# Patient Record
Sex: Male | Born: 1945
Health system: Southern US, Community
[De-identification: ages and names within clinical notes are randomized; demographics above are authoritative.]

## PROBLEM LIST (undated history)

## (undated) DIAGNOSIS — F17200 Nicotine dependence, unspecified, uncomplicated: Secondary | ICD-10-CM

## (undated) HISTORY — DX: Nicotine dependence, unspecified, uncomplicated: F17.200

---

## 2008-10-30 ENCOUNTER — Ambulatory Visit (HOSPITAL_COMMUNITY): Admission: RE | Admit: 2008-10-30 | Discharge: 2008-10-30 | Payer: Self-pay | Admitting: Internal Medicine

## 2010-09-12 ENCOUNTER — Other Ambulatory Visit (HOSPITAL_COMMUNITY): Payer: Self-pay | Admitting: Gastroenterology

## 2010-09-12 ENCOUNTER — Other Ambulatory Visit: Payer: Self-pay | Admitting: Gastroenterology

## 2010-09-12 DIAGNOSIS — K2970 Gastritis, unspecified, without bleeding: Secondary | ICD-10-CM

## 2010-09-16 ENCOUNTER — Other Ambulatory Visit: Payer: Self-pay

## 2010-09-17 ENCOUNTER — Ambulatory Visit
Admission: RE | Admit: 2010-09-17 | Discharge: 2010-09-17 | Disposition: A | Discharge status: Home / Self Care | Payer: BC Managed Care – PPO | Source: Ambulatory Visit | Attending: Gastroenterology | Admitting: Gastroenterology

## 2010-09-17 DIAGNOSIS — K2970 Gastritis, unspecified, without bleeding: Secondary | ICD-10-CM

## 2010-09-25 ENCOUNTER — Ambulatory Visit (HOSPITAL_COMMUNITY)
Admission: RE | Admit: 2010-09-25 | Discharge: 2010-09-25 | Disposition: A | Discharge status: Home / Self Care | Payer: BC Managed Care – PPO | Source: Ambulatory Visit | Attending: Gastroenterology | Admitting: Gastroenterology

## 2010-09-25 DIAGNOSIS — R141 Gas pain: Secondary | ICD-10-CM | POA: Insufficient documentation

## 2010-09-25 DIAGNOSIS — R143 Flatulence: Secondary | ICD-10-CM | POA: Insufficient documentation

## 2010-09-25 DIAGNOSIS — K2970 Gastritis, unspecified, without bleeding: Secondary | ICD-10-CM

## 2010-09-25 DIAGNOSIS — R1110 Vomiting, unspecified: Secondary | ICD-10-CM | POA: Insufficient documentation

## 2010-09-25 DIAGNOSIS — R142 Eructation: Secondary | ICD-10-CM | POA: Insufficient documentation

## 2010-09-25 MED ORDER — TECHNETIUM TC 99M SULFUR COLLOID
2.0000 | Freq: Once | INTRAVENOUS | Status: AC | PRN
  Administered 2010-09-25: 2 via INTRAVENOUS

## 2011-02-26 ENCOUNTER — Emergency Department (HOSPITAL_COMMUNITY): Payer: No Typology Code available for payment source

## 2011-02-26 ENCOUNTER — Encounter: Payer: Self-pay | Admitting: Emergency Medicine

## 2011-02-26 ENCOUNTER — Emergency Department (HOSPITAL_COMMUNITY)
Admission: EM | Admit: 2011-02-26 | Discharge: 2011-02-26 | Disposition: A | Discharge status: Home / Self Care | Payer: No Typology Code available for payment source | Attending: Emergency Medicine | Admitting: Emergency Medicine

## 2011-02-26 DIAGNOSIS — M542 Cervicalgia: Secondary | ICD-10-CM | POA: Insufficient documentation

## 2011-02-26 DIAGNOSIS — R51 Headache: Secondary | ICD-10-CM | POA: Insufficient documentation

## 2011-02-26 DIAGNOSIS — M546 Pain in thoracic spine: Secondary | ICD-10-CM | POA: Insufficient documentation

## 2011-02-26 DIAGNOSIS — R109 Unspecified abdominal pain: Secondary | ICD-10-CM | POA: Insufficient documentation

## 2011-02-26 DIAGNOSIS — R0602 Shortness of breath: Secondary | ICD-10-CM | POA: Insufficient documentation

## 2011-02-26 DIAGNOSIS — R079 Chest pain, unspecified: Secondary | ICD-10-CM | POA: Insufficient documentation

## 2011-02-26 LAB — POCT I-STAT, CHEM 8
HCT: 50 % (ref 39.0–52.0)
Hemoglobin: 17 g/dL (ref 13.0–17.0)
Sodium: 142 mEq/L (ref 135–145)
TCO2: 23 mmol/L (ref 0–100)

## 2011-02-26 LAB — CBC
HCT: 46.4 % (ref 39.0–52.0)
MCHC: 36.4 g/dL — ABNORMAL HIGH (ref 30.0–36.0)
Platelets: 233 10*3/uL (ref 150–400)
RDW: 12.4 % (ref 11.5–15.5)

## 2011-02-26 LAB — COMPREHENSIVE METABOLIC PANEL
ALT: 29 U/L (ref 0–53)
Alkaline Phosphatase: 87 U/L (ref 39–117)
CO2: 25 mEq/L (ref 19–32)
Chloride: 104 mEq/L (ref 96–112)
GFR calc Af Amer: >90 mL/min (ref >90)
Glucose, Bld: 109 mg/dL — ABNORMAL HIGH (ref 70–99)
Potassium: 4.2 mEq/L (ref 3.5–5.1)
Sodium: 139 mEq/L (ref 135–145)
Total Bilirubin: 0.9 mg/dL (ref 0.3–1.2)
Total Protein: 7.5 g/dL (ref 6.0–8.3)

## 2011-02-26 LAB — URINALYSIS, MICROSCOPIC ONLY
Bilirubin Urine: NEGATIVE
Hgb urine dipstick: NEGATIVE
Protein, ur: NEGATIVE mg/dL
Urobilinogen, UA: 0.2 mg/dL (ref 0.0–1.0)

## 2011-02-26 LAB — PROTIME-INR
INR: 0.95 (ref 0.00–1.49)
Prothrombin Time: 12.9 seconds (ref 11.6–15.2)

## 2011-02-26 LAB — SAMPLE TO BLOOD BANK

## 2011-02-26 MED ORDER — ONDANSETRON HCL 4 MG/2ML IJ SOLN
INTRAMUSCULAR | Status: AC
  Filled 2011-02-26: qty 2

## 2011-02-26 MED ORDER — IOHEXOL 300 MG/ML  SOLN
100.0000 mL | Freq: Once | INTRAMUSCULAR | Status: AC | PRN
  Administered 2011-02-26: 100 mL via INTRAVENOUS

## 2011-02-26 MED ORDER — IBUPROFEN 800 MG PO TABS
800.0000 mg | ORAL_TABLET | Freq: Once | ORAL | Status: AC
  Administered 2011-02-26: 800 mg via ORAL
  Filled 2011-02-26: qty 1

## 2011-02-26 MED ORDER — SODIUM CHLORIDE 0.9 % IV BOLUS (SEPSIS)
1000.0000 mL | Freq: Once | INTRAVENOUS | Status: AC
  Administered 2011-02-26: 1000 mL via INTRAVENOUS

## 2011-02-26 MED ORDER — HYDROCODONE-ACETAMINOPHEN 5-500 MG PO TABS: 1.0000 | ORAL_TABLET | Freq: Four times a day (QID) | ORAL | Status: AC | PRN

## 2011-02-26 MED ORDER — ONDANSETRON HCL 8 MG PO TABS
4.0000 mg | ORAL_TABLET | Freq: Once | ORAL | Status: AC
  Administered 2011-02-26: 4 mg via ORAL

## 2011-02-26 MED ORDER — ONDANSETRON 4 MG PO TBDP
ORAL_TABLET | ORAL | Status: AC
  Administered 2011-02-26: 4 mg
  Filled 2011-02-26: qty 1

## 2011-02-26 MED ORDER — IBUPROFEN 800 MG PO TABS: 800.0000 mg | ORAL_TABLET | Freq: Three times a day (TID) | ORAL | Status: DC
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
## 2011-02-26 NOTE — ED Notes (Signed)
Pt c/o of discomfort from c-collar. Awaiting radiology results to clear c-collar. Pt told via family intpretering.

## 2011-02-27 ENCOUNTER — Encounter (HOSPITAL_COMMUNITY): Payer: Self-pay | Admitting: Emergency Medicine

## 2011-03-01 NOTE — ED Provider Notes (Signed)
I saw and evaluated the patient, reviewed the resident's note and I agree with the findings and plan.  Klohe Lovering A. Patrica Duel, MD 03/01/11 [phone]

## 2011-06-03 DIAGNOSIS — Z86010 Personal history of colonic polyps: Secondary | ICD-10-CM | POA: Diagnosis not present

## 2011-06-03 DIAGNOSIS — D126 Benign neoplasm of colon, unspecified: Secondary | ICD-10-CM | POA: Diagnosis not present

## 2011-06-03 DIAGNOSIS — Z09 Encounter for follow-up examination after completed treatment for conditions other than malignant neoplasm: Secondary | ICD-10-CM | POA: Diagnosis not present

## 2011-06-03 DIAGNOSIS — K5730 Diverticulosis of large intestine without perforation or abscess without bleeding: Secondary | ICD-10-CM | POA: Diagnosis not present

## 2011-12-15 DIAGNOSIS — H2510 Age-related nuclear cataract, unspecified eye: Secondary | ICD-10-CM | POA: Diagnosis not present

## 2011-12-28 DIAGNOSIS — H1045 Other chronic allergic conjunctivitis: Secondary | ICD-10-CM | POA: Diagnosis not present

## 2012-01-05 DIAGNOSIS — H1045 Other chronic allergic conjunctivitis: Secondary | ICD-10-CM | POA: Diagnosis not present

## 2012-01-09 ENCOUNTER — Encounter (HOSPITAL_COMMUNITY): Payer: Self-pay | Admitting: *Deleted

## 2012-01-09 DIAGNOSIS — R110 Nausea: Secondary | ICD-10-CM | POA: Insufficient documentation

## 2012-01-09 DIAGNOSIS — F17200 Nicotine dependence, unspecified, uncomplicated: Secondary | ICD-10-CM | POA: Diagnosis not present

## 2012-01-09 DIAGNOSIS — R42 Dizziness and giddiness: Secondary | ICD-10-CM | POA: Diagnosis not present

## 2012-01-09 DIAGNOSIS — R51 Headache: Secondary | ICD-10-CM | POA: Diagnosis not present

## 2012-01-09 DIAGNOSIS — R109 Unspecified abdominal pain: Secondary | ICD-10-CM | POA: Diagnosis not present

## 2012-01-09 LAB — COMPREHENSIVE METABOLIC PANEL
AST: 25 U/L (ref 0–37)
Albumin: 3.8 g/dL (ref 3.5–5.2)
Alkaline Phosphatase: 68 U/L (ref 39–117)
Chloride: 105 mEq/L (ref 96–112)
Creatinine, Ser: 0.9 mg/dL (ref 0.50–1.35)
Potassium: 4 mEq/L (ref 3.5–5.1)
Total Bilirubin: 0.8 mg/dL (ref 0.3–1.2)
Total Protein: 7.4 g/dL (ref 6.0–8.3)

## 2012-01-09 LAB — URINALYSIS, ROUTINE W REFLEX MICROSCOPIC
Bilirubin Urine: NEGATIVE
Nitrite: NEGATIVE
Specific Gravity, Urine: 1.025 (ref 1.005–1.030)
pH: 6.5 (ref 5.0–8.0)

## 2012-01-09 LAB — CBC WITH DIFFERENTIAL/PLATELET
Basophils Absolute: 0 10*3/uL (ref 0.0–0.1)
Basophils Relative: 0 % (ref 0–1)
MCHC: 36.4 g/dL — ABNORMAL HIGH (ref 30.0–36.0)
Neutro Abs: 3.7 10*3/uL (ref 1.7–7.7)
Neutrophils Relative %: 54 % (ref 43–77)
RDW: 12.3 % (ref 11.5–15.5)

## 2012-01-09 LAB — LIPASE, BLOOD: Lipase: 36 U/L (ref 11–59)

## 2012-01-09 NOTE — ED Notes (Signed)
The pt has a headache and abd pain with vomiting.  langauge barrier

## 2012-01-10 ENCOUNTER — Encounter (HOSPITAL_COMMUNITY): Payer: Self-pay | Admitting: Emergency Medicine

## 2012-01-10 ENCOUNTER — Emergency Department (HOSPITAL_COMMUNITY)
Admission: EM | Admit: 2012-01-10 | Discharge: 2012-01-10 | Disposition: A | Discharge status: Home / Self Care | Payer: Medicare Other | Attending: Emergency Medicine | Admitting: Emergency Medicine

## 2012-01-10 DIAGNOSIS — R42 Dizziness and giddiness: Secondary | ICD-10-CM

## 2012-01-10 MED ORDER — MECLIZINE HCL 25 MG PO TABS: 25.0000 mg | ORAL_TABLET | Freq: Three times a day (TID) | ORAL | Status: DC | PRN

## 2012-01-10 NOTE — ED Notes (Signed)
Patient said he was bloated this morning and was nauseated. Patient says he went to work and at work he started to have a headache and dizzy and so he responded to the ED.

## 2012-01-10 NOTE — ED Provider Notes (Signed)
History     CSN: [id number]  Arrival date & time 01/09/12  2122   First MD Initiated Contact with Patient 01/10/12 0214      Chief Complaint  Patient presents with  . Headache    (Consider location/radiation/quality/duration/timing/severity/associated sxs/prior treatment) Patient is a 66 y.o. male presenting with headaches. The history is provided by the patient. A language interpreter was used.  Headache   He had onset this morning of symptom complex which included a headache, dizzy feeling, and his abdomen feeling hard or is too much air in it. There was some associated nausea but without vomiting. He noticed that he was off balance. Dizziness feeling was like the room was spinning around. Symptoms have now resolved. All symptoms tended to come on in concert and debate in concert. Dizziness was worsened by head movement. Symptoms were severe but have now resolved. He did not treat himself with any medications.  History reviewed. No pertinent past medical history.  History reviewed. No pertinent past surgical history.  History reviewed. No pertinent family history.  History  Substance Use Topics  . Smoking status: Current Every Day Smoker -- 0.5 packs/day  . Smokeless tobacco: Not on file  . Alcohol Use: 1.8 oz/week    3 Cans of beer per week      Review of Systems  Neurological: Positive for headaches.  All other systems reviewed and are negative.    Allergies  Penicillins  Home Medications   Current Outpatient Rx  Name Route Sig Dispense Refill  . IBUPROFEN 800 MG PO TABS Oral Take 1 tablet (800 mg total) by mouth 3 (three) times daily. 21 tablet 0  . OMEGA-3-ACID ETHYL ESTERS 1 G PO CAPS Oral Take 1 g by mouth daily.        BP 126/92  Pulse 57  Temp 97.9 F (36.6 C) (Oral)  Resp 18  SpO2 98%  Physical Exam  Nursing note and vitals reviewed. 66 year old male, resting comfortably and in no acute distress. Vital signs are significant for hypertension  with blood pressure 180/99. Oxygen saturation is 98%, which is normal. Head is normocephalic and atraumatic. PERRLA, EOMI. Oropharynx is clear. Neck is nontender and supple without adenopathy or JVD. There no carotid bruits. Back is nontender and there is no CVA tenderness. Lungs are clear without rales, wheezes, or rhonchi. Chest is nontender. Heart has regular rate and rhythm without murmur. Abdomen is soft, flat, nontender without masses or hepatosplenomegaly and peristalsis is normoactive. Extremities have no cyanosis or edema, full range of motion is present. Skin is warm and dry without rash. Neurologic: Mental status is normal, cranial nerves are intact, there are no motor or sensory deficits. Dizziness is not reproduced by head movement.   ED Course  Procedures (including critical care time)  Results for orders placed during the hospital encounter of 01/10/12  CBC WITH DIFFERENTIAL      Component Value Range   WBC 6.9  4.0 - 10.5 K/uL   RBC 4.91  4.22 - 5.81 MIL/uL   Hemoglobin 16.8  13.0 - 17.0 g/dL   HCT 40.9  81.1 - 91.4 %   MCV 93.9  78.0 - 100.0 fL   MCH 34.2 (*) 26.0 - 34.0 pg   MCHC 36.4 (*) 30.0 - 36.0 g/dL   RDW 78.2  95.6 - 21.3 %   Platelets 251  150 - 400 K/uL   Neutrophils Relative 54  43 - 77 %   Neutro Abs 3.7  1.7 -  7.7 K/uL   Lymphocytes Relative 37  12 - 46 %   Lymphs Abs 2.6  0.7 - 4.0 K/uL   Monocytes Relative 8  3 - 12 %   Monocytes Absolute 0.6  0.1 - 1.0 K/uL   Eosinophils Relative 1  0 - 5 %   Eosinophils Absolute 0.0  0.0 - 0.7 K/uL   Basophils Relative 0  0 - 1 %   Basophils Absolute 0.0  0.0 - 0.1 K/uL  COMPREHENSIVE METABOLIC PANEL      Component Value Range   Sodium 140  135 - 145 mEq/L   Potassium 4.0  3.5 - 5.1 mEq/L   Chloride 105  96 - 112 mEq/L   CO2 22  19 - 32 mEq/L   Glucose, Bld 108 (*) 70 - 99 mg/dL   BUN 17  6 - 23 mg/dL   Creatinine, Ser 6.21  0.50 - 1.35 mg/dL   Calcium 9.4  8.4 - 30.8 mg/dL   Total Protein 7.4  6.0 -  8.3 g/dL   Albumin 3.8  3.5 - 5.2 g/dL   AST 25  0 - 37 U/L   ALT 16  0 - 53 U/L   Alkaline Phosphatase 68  39 - 117 U/L   Total Bilirubin 0.8  0.3 - 1.2 mg/dL   GFR calc non Af Amer 87 (*) >90 mL/min   GFR calc Af Amer >90  >90 mL/min  TROPONIN I      Component Value Range   Troponin I <0.30  <0.30 ng/mL  LIPASE, BLOOD      Component Value Range   Lipase 36  11 - 59 U/L  URINALYSIS, ROUTINE W REFLEX MICROSCOPIC      Component Value Range   Color, Urine YELLOW  YELLOW   APPearance CLEAR  CLEAR   Specific Gravity, Urine 1.025  1.005 - 1.030   pH 6.5  5.0 - 8.0   Glucose, UA NEGATIVE  NEGATIVE mg/dL   Hgb urine dipstick NEGATIVE  NEGATIVE   Bilirubin Urine NEGATIVE  NEGATIVE   Ketones, ur NEGATIVE  NEGATIVE mg/dL   Protein, ur NEGATIVE  NEGATIVE mg/dL   Urobilinogen, UA 1.0  0.0 - 1.0 mg/dL   Nitrite NEGATIVE  NEGATIVE   Leukocytes, UA NEGATIVE  NEGATIVE    1. Vertigo       MDM  Symptom complex which seems most compatible with peripheral vertigo. His complaint of abdominal pain is actually his abdomen feeling tight like there was a lot of gas in it and this was only occurring during times that he was experiencing vertigo. Abdominal exam is completely benign and laboratory workup is completely normal. At this point, I will treat him symptomatically with meclizine. He does have a history of having had a colonoscopy and if he has further GI complaints, he is to followup with his gastroenterologist.        Dione Booze, MD 01/10/12 [phone]

## 2012-01-10 NOTE — ED Notes (Signed)
Patient is alert and orientedx4.  Patient's wife was able to read the discharge instructions in her language and had no questions.  Patient's wife is transporting husband home.

## 2012-01-19 DIAGNOSIS — H1045 Other chronic allergic conjunctivitis: Secondary | ICD-10-CM | POA: Diagnosis not present

## 2012-01-25 ENCOUNTER — Ambulatory Visit (INDEPENDENT_AMBULATORY_CARE_PROVIDER_SITE_OTHER): Payer: Medicare Other | Admitting: Internal Medicine

## 2012-01-25 ENCOUNTER — Encounter: Payer: Self-pay | Admitting: Internal Medicine

## 2012-01-25 VITALS — BP 130/62 | HR 53 | Temp 97.8°F | Resp 16 | Ht 64.0 in | Wt 147.8 lb

## 2012-01-25 DIAGNOSIS — Z23 Encounter for immunization: Secondary | ICD-10-CM | POA: Diagnosis not present

## 2012-01-25 DIAGNOSIS — K219 Gastro-esophageal reflux disease without esophagitis: Secondary | ICD-10-CM

## 2012-01-25 DIAGNOSIS — K2960 Other gastritis without bleeding: Secondary | ICD-10-CM | POA: Diagnosis not present

## 2012-01-25 DIAGNOSIS — T39395A Adverse effect of other nonsteroidal anti-inflammatory drugs [NSAID], initial encounter: Secondary | ICD-10-CM

## 2012-01-25 DIAGNOSIS — F17200 Nicotine dependence, unspecified, uncomplicated: Secondary | ICD-10-CM | POA: Insufficient documentation

## 2012-01-25 DIAGNOSIS — K319 Disease of stomach and duodenum, unspecified: Secondary | ICD-10-CM

## 2012-01-25 DIAGNOSIS — F809 Developmental disorder of speech and language, unspecified: Secondary | ICD-10-CM

## 2012-01-25 DIAGNOSIS — R42 Dizziness and giddiness: Secondary | ICD-10-CM | POA: Diagnosis not present

## 2012-01-25 MED ORDER — PNEUMOCOCCAL VAC POLYVALENT 25 MCG/0.5ML IJ INJ: 0.5000 mL | INJECTION | INTRAMUSCULAR | Status: DC

## 2012-01-25 MED ORDER — OMEPRAZOLE 40 MG PO CPDR: 40.0000 mg | DELAYED_RELEASE_CAPSULE | Freq: Every day | ORAL | Status: DC

## 2012-01-25 NOTE — Patient Instructions (Addendum)
Gastroesophageal Reflux Disease, Adult Gastroesophageal reflux disease (GERD) happens when acid from your stomach flows up into the esophagus. When acid comes in contact with the esophagus, the acid causes soreness (inflammation) in the esophagus. Over time, GERD may create small holes (ulcers) in the lining of the esophagus. CAUSES   Increased body weight. This puts pressure on the stomach, making acid rise from the stomach into the esophagus.  Smoking. This increases acid production in the stomach.  Drinking alcohol. This causes decreased pressure in the lower esophageal sphincter (valve or ring of muscle between the esophagus and stomach), allowing acid from the stomach into the esophagus.  Late evening meals and a full stomach. This increases pressure and acid production in the stomach.  A malformed lower esophageal sphincter. Sometimes, no cause is found. SYMPTOMS   Burning pain in the lower part of the mid-chest behind the breastbone and in the mid-stomach area. This may occur twice a week or more often.  Trouble swallowing.  Sore throat.  Dry cough.  Asthma-like symptoms including chest tightness, shortness of breath, or wheezing. DIAGNOSIS  Your caregiver may be able to diagnose GERD based on your symptoms. In some cases, X-rays and other tests may be done to check for complications or to check the condition of your stomach and esophagus. TREATMENT  Your caregiver may recommend over-the-counter or prescription medicines to help decrease acid production. Ask your caregiver before starting or adding any new medicines.  HOME CARE INSTRUCTIONS   Change the factors that you can control. Ask your caregiver for guidance concerning weight loss, quitting smoking, and alcohol consumption.  Avoid foods and drinks that make your symptoms worse, such as:  Caffeine or alcoholic drinks.  Chocolate.  Peppermint or mint flavorings.  Garlic and onions.  Spicy foods.  Citrus fruits,  such as oranges, lemons, or limes.  Tomato-based foods such as sauce, chili, salsa, and pizza.  Fried and fatty foods.  Avoid lying down for the 3 hours prior to your bedtime or prior to taking a nap.  Eat small, frequent meals instead of large meals.  Wear loose-fitting clothing. Do not wear anything tight around your waist that causes pressure on your stomach.  Raise the head of your bed 6 to 8 inches with wood blocks to help you sleep. Extra pillows will not help.  Only take over-the-counter or prescription medicines for pain, discomfort, or fever as directed by your caregiver.  Do not take aspirin, ibuprofen, or other nonsteroidal anti-inflammatory drugs (NSAIDs). SEEK IMMEDIATE MEDICAL CARE IF:   You have pain in your arms, neck, jaw, teeth, or back.  Your pain increases or changes in intensity or duration.  You develop nausea, vomiting, or sweating (diaphoresis).  You develop shortness of breath, or you faint.  Your vomit is green, yellow, black, or looks like coffee grounds or blood.  Your stool is red, bloody, or black. These symptoms could be signs of other problems, such as heart disease, gastric bleeding, or esophageal bleeding. MAKE SURE YOU:   Understand these instructions.  Will watch your condition.  Will get help right away if you are not doing well or get worse. Document Released: 01/14/2005 Document Revised: 06/29/2011 Document Reviewed: 10/24/2010 [employer] Patient Information 2013 [employer], [place]. NSAIDs and Peptic Ulcers A peptic ulcer is a defect that forms in the lining of the stomach or first part of the small intestine.  CAUSES  Most peptic ulcers are caused by infection with the bacterium H. pylori (Helicobacter pylori). Some peptic ulcers  are caused by prolonged use of NSAIDs (nonsteroidal anti-inflammatory drugs). NSAIDs include aspirin, ibuprofen, and naproxen sodium. SYMPTOMS  An ulcer can cause:  A gnawing, burning pain in the upper  abdomen.  Nausea.  Vomiting.  Loss of appetite.  Weight loss.  Fatigue. Normally the stomach has three defenses against digestive juices:   Mucus that coats the stomach lining and shields it from stomach acid.  The chemical bicarbonate that neutralizes stomach acid.  Blood circulation to the stomach lining that aids in cell renewal and repair. NSAIDs hinder all of these protective mechanisms. With the stomach's defenses down, digestive juices can damage the sensitive stomach lining and cause ulcers. TREATMENT   NSAID-induced ulcers usually heal once the person stops taking the medication. Your caregiver may recommend taking antacids to neutralize the acid. Antacids help the healing process and relieve symptoms. Drugs called H2-blockers or proton-pump inhibitors decrease the amount of acid the stomach produces. Medicines that protect the stomach lining also help with healing.  If a person with an NSAID ulcer also tests positive for H. pylori, he or she will be treated with antibiotics.  Surgery may be necessary if an ulcer recurs or fails to heal.  Surgery may also be necessary if complications like:  Severe bleeding.  Perforation.  Obstruction develop. Anyone taking NSAIDs who experiences symptoms of peptic ulcer should see their caregiver for prompt treatment. Delaying diagnosis and treatment can lead to complications and the need for surgery. FOR MORE INFORMATION National Digestive Diseases Information Clearinghouse: http://digestive.EntertainmentBlogging.co.nz.htm Document Released: 03/12/2004 Document Revised: 06/29/2011 Document Reviewed: 03/22/2007 [facility] Patient Information 2013 [place], [place].

## 2012-01-25 NOTE — Progress Notes (Signed)
  Subjective:    Patient ID: Harry Arellano, male    DOB: [birth year], 66 y.o.   MRN: [id number]  HPI Dizzyness resolved, ER visit and all tests reviewed, wife interpretor. Has nsaid gastopathy and is using ibuprofen! Continues to smoke,has nausea with eating. Genella Rife also No weight loss. LFTs and lipase nl in er. Old ct showed cholelithiasis.   Review of Systems Needs cpe    Objective:   Physical Exam  Constitutional: He appears well-developed and well-nourished. No distress.  HENT:  Right Ear: External ear normal.  Left Ear: External ear normal.  Nose: Nose normal.  Eyes: EOM are normal. No scleral icterus.  Neck: Normal range of motion. Neck supple. No thyromegaly present.  Cardiovascular: Normal rate.   Pulmonary/Chest: Effort normal and breath sounds normal.  Abdominal: Soft. Bowel sounds are normal. He exhibits no mass. There is tenderness. There is no rebound and no guarding.  Neurological: He is alert. No cranial nerve deficit. He exhibits normal muscle tone. Coordination normal.  Skin: Skin is warm and dry.  Psychiatric: He has a normal mood and affect. His behavior is normal.          Assessment & Plan:  Flu vac/pneumovax Prilosec restart CPE 1-2 mo

## 2012-04-18 ENCOUNTER — Encounter: Payer: Medicare Other | Admitting: Internal Medicine

## 2012-09-15 ENCOUNTER — Ambulatory Visit: Payer: Medicare Other

## 2012-09-15 ENCOUNTER — Ambulatory Visit (INDEPENDENT_AMBULATORY_CARE_PROVIDER_SITE_OTHER): Payer: Medicare Other | Admitting: Family Medicine

## 2012-09-15 VITALS — BP 104/80 | HR 65 | Temp 97.9°F | Resp 18 | Ht 65.0 in | Wt 148.0 lb

## 2012-09-15 DIAGNOSIS — K219 Gastro-esophageal reflux disease without esophagitis: Secondary | ICD-10-CM | POA: Diagnosis not present

## 2012-09-15 DIAGNOSIS — R5381 Other malaise: Secondary | ICD-10-CM | POA: Diagnosis not present

## 2012-09-15 DIAGNOSIS — Z789 Other specified health status: Secondary | ICD-10-CM | POA: Insufficient documentation

## 2012-09-15 DIAGNOSIS — Z758 Other problems related to medical facilities and other health care: Secondary | ICD-10-CM | POA: Insufficient documentation

## 2012-09-15 DIAGNOSIS — M549 Dorsalgia, unspecified: Secondary | ICD-10-CM

## 2012-09-15 DIAGNOSIS — R21 Rash and other nonspecific skin eruption: Secondary | ICD-10-CM

## 2012-09-15 DIAGNOSIS — R5383 Other fatigue: Secondary | ICD-10-CM | POA: Diagnosis not present

## 2012-09-15 DIAGNOSIS — R110 Nausea: Secondary | ICD-10-CM

## 2012-09-15 LAB — POCT CBC
HCT, POC: 52.4 % (ref 43.5–53.7)
Lymph, poc: 1.8 (ref 0.6–3.4)
MCH, POC: 33.7 pg — AB (ref 27–31.2)
MCHC: 33 g/dL (ref 31.8–35.4)
MCV: 102 fL — AB (ref 80–97)
POC Granulocyte: 4.4 (ref 2–6.9)
POC LYMPH PERCENT: 27 %L (ref 10–50)
RDW, POC: 14.1 %
WBC: 6.6 10*3/uL (ref 4.6–10.2)

## 2012-09-15 LAB — COMPREHENSIVE METABOLIC PANEL
BUN: 12 mg/dL (ref 6–23)
CO2: 26 mEq/L (ref 19–32)
Calcium: 9.6 mg/dL (ref 8.4–10.5)
Chloride: 104 mEq/L (ref 96–112)
Creat: 0.86 mg/dL (ref 0.50–1.35)
Glucose, Bld: 123 mg/dL — ABNORMAL HIGH (ref 70–99)
Total Bilirubin: 1 mg/dL (ref 0.3–1.2)

## 2012-09-15 LAB — GLUCOSE, POCT (MANUAL RESULT ENTRY): POC Glucose: 116 mg/dl — AB (ref 70–99)

## 2012-09-15 MED ORDER — ONDANSETRON HCL 8 MG PO TABS: 8.0000 mg | ORAL_TABLET | Freq: Three times a day (TID) | ORAL | Status: DC | PRN

## 2012-09-15 MED ORDER — OMEPRAZOLE 40 MG PO CPDR: 40.0000 mg | DELAYED_RELEASE_CAPSULE | Freq: Every day | ORAL | Status: DC

## 2012-09-15 MED ORDER — ONDANSETRON 4 MG PO TBDP
4.0000 mg | ORAL_TABLET | Freq: Once | ORAL | Status: AC
  Administered 2012-09-15: 4 mg via ORAL

## 2012-09-15 MED ORDER — TRIAMCINOLONE ACETONIDE 0.1 % EX CREA: TOPICAL_CREAM | Freq: Two times a day (BID) | CUTANEOUS | Status: DC

## 2012-09-15 NOTE — Patient Instructions (Addendum)
Please take the prilosec (omeprazole) once a day.  You can also use the carafate as needed for nausea.    If you do not continue to feel better please let me know.    Your blood sugar is not too high, but it is borderline.  Also, your red blood cell size is up.  Please come and see [place] for a complete physical in the next few months.

## 2012-09-15 NOTE — Progress Notes (Addendum)
[facility] [address] [place] [address] [phone]  Date:  09/15/2012   Name:  Harry Arellano   DOB:  10-26-1945   MRN:  [id number]  PCP:  No primary provider on file.    Chief Complaint: Dizziness   History of Present Illness:  Harry Arellano is a 67 y.o. very pleasant male patient who presents with the following:  He was seen at the ED last fall with vertigo and was treated with meclinzine.   Here today with a friend who helped to interpret for him.  This morning he noted a HA, but this is now resolved.   He also was dizzy this morning, had nausea but not vomiting.   His also notes a pain in his upper back today- noted this morning.  It is now "80%" better  No longer feels dizzy.    He is no longer taking prilosec.  He notes that he is having burping again. He feels bloated and "filled with air."  He does not have any CP, no SOB He does also note irritated skin on both arm for the last year or so.  He has not used anything for this so far.    Patient Active Problem List   Diagnosis Date Noted  . NSAID-associated gastropathy 01/25/2012  . Nicotine addiction 01/25/2012  . GERD (gastroesophageal reflux disease) 01/25/2012    Past Medical History  Diagnosis Date  . Smoker     History reviewed. No pertinent past surgical history.  History  Substance Use Topics  . Smoking status: Former Smoker -- 0.50 packs/day  . Smokeless tobacco: Not on file  . Alcohol Use: 1.8 oz/week    3 Cans of beer per week    History reviewed. No pertinent family history.  Allergies  Allergen Reactions  . Penicillins Other (See Comments)    Pass out     Medication list has been reviewed and updated.  Current Outpatient Prescriptions on File Prior to Visit  Medication Sig Dispense Refill  . ibuprofen (ADVIL,MOTRIN) 200 MG tablet Take 200-800 mg by mouth every 6 (six) hours as needed. Headache or pain      . meclizine (ANTIVERT) 25 MG tablet Take 1 tablet (25 mg  total) by mouth 3 (three) times daily as needed.  30 tablet  0  . omeprazole (PRILOSEC) 40 MG capsule Take 1 capsule (40 mg total) by mouth daily.  90 capsule  3   No current facility-administered medications on file prior to visit.    Review of Systems:  As per HPI- otherwise negative.   Physical Examination: Filed Vitals:   09/15/12 1316  BP: 104/80  Pulse: 65  Temp: 97.9 F (36.6 C)  Resp: 18   Filed Vitals:   09/15/12 1316  Height: 5\' 5"  (1.651 m)  Weight: 148 lb (67.132 kg)   Body mass index is 24.63 kg/(m^2). Ideal Body Weight: Weight in (lb) to have BMI = 25: 149.9  GEN: WDWN, NAD, Non-toxic, A & O x 3, looks well HEENT: Atraumatic, Normocephalic. Neck supple. No masses, No LAD.  Bilateral TM wnl, oropharynx normal.  PEERL,EOMI.   Ears and Nose: No external deformity.   CV: RRR, No M/G/R. No JVD. No thrill. No extra heart sounds. PULM: CTA B, no wheezes, crackles, rhonchi. No retractions. No resp. distress. No accessory muscle use. ABD: S, NT, ND, +BS. No rebound. No HSM.  Upon lying down he had belching and discomfort He has mild, reproducible tenderness in  the muscles around his right shoulder blade.  Tender to pressure, but not with movement of his right arm.  EXTR: No c/c/e NEURO Normal gait. Normal strength, sensation and DTR all extremities.  Normal balance.  No meningismus.   PSYCH: Normally interactive. Conversant. Not depressed or anxious appearing.  Calm demeanor.  The skin of both arms is slightly red and irritated.    [facility] reading (PRIMARY) by  Dr. Patsy Lager. VHQ:IONGEX *RADIOLOGY REPORT*  Clinical Data: Pain in left upper back.  CHEST - 2 VIEW  Comparison: None.  Findings: Lateral view is motion degraded. Midline trachea. Borderline cardiomegaly. Mediastinal contours otherwise within normal limits. Mild right hemidiaphragm elevation. No pleural effusion or pneumothorax. Clear lungs. Degenerative disc disease in the mid lumbar  spine.  IMPRESSION: No acute cardiopulmonary disease.  EKG: NSR, no change from previous EKG  Results for orders placed in visit on 09/15/12  POCT CBC      Result Value Range   WBC 6.6  4.6 - 10.2 K/uL   Lymph, poc 1.8  0.6 - 3.4   POC LYMPH PERCENT 27.0  10 - 50 %L   MID (cbc) 0.4  0 - 0.9   POC MID % 6.1  0 - 12 %M   POC Granulocyte 4.4  2 - 6.9   Granulocyte percent 66.9  37 - 80 %G   RBC 5.14  4.69 - 6.13 M/uL   Hemoglobin 17.3  14.1 - 18.1 g/dL   HCT, POC 52.8  41.3 - 53.7 %   MCV 102.0 (*) 80 - 97 fL   MCH, POC 33.7 (*) 27 - 31.2 pg   MCHC 33.0  31.8 - 35.4 g/dL   RDW, POC 24.4     Platelet Count, POC 263  142 - 424 K/uL   MPV 8.4  0 - 99.8 fL  GLUCOSE, POCT (MANUAL RESULT ENTRY)      Result Value Range   POC Glucose 116 (*) 70 - 99 mg/dl   Given zofran 4mg , and omaprazole 20 mg Assessment and Plan: Nausea alone - Plan: ondansetron (ZOFRAN-ODT) disintegrating tablet 4 mg, ondansetron (ZOFRAN) 8 MG tablet  GERD (gastroesophageal reflux disease) - Plan: omeprazole (PRILOSEC) 40 MG capsule  Other malaise and fatigue - Plan: POCT CBC, POCT glucose (manual entry), Comprehensive metabolic panel  Back pain - Plan: DG Chest 2 View, EKG 12-Lead  Rash, skin - Plan: triamcinolone cream (KENALOG) 0.1 %  Language barrier  [name] is here today with somewhat vague symptoms of dizziness, headache, and what sounds like GERD today.  At this point he states that his symptoms are nearly 100% resolved.  He does not have any weakness or numbness.  No CP or SOB.  He felt better after zofran and omeprazole here in clinic.   Will continue to use a PPI, and zofran as needed for nausea He may use triamcinolone cream as needed on his arms.    Signed Abbe Amsterdam, MD  09/19/12- addnd.  Called again- someone picked up at pt's number but hung up because we do not speak the same language.  LMOM with his son's cell phone. Letter mailed today as well

## 2012-09-19 ENCOUNTER — Encounter: Payer: Self-pay | Admitting: Family Medicine

## 2012-11-01 DIAGNOSIS — H02059 Trichiasis without entropian unspecified eye, unspecified eyelid: Secondary | ICD-10-CM | POA: Diagnosis not present

## 2012-12-13 DIAGNOSIS — H11009 Unspecified pterygium of unspecified eye: Secondary | ICD-10-CM | POA: Diagnosis not present

## 2013-01-04 DIAGNOSIS — H269 Unspecified cataract: Secondary | ICD-10-CM | POA: Diagnosis not present

## 2013-01-04 DIAGNOSIS — H11009 Unspecified pterygium of unspecified eye: Secondary | ICD-10-CM | POA: Diagnosis not present

## 2013-05-11 DIAGNOSIS — H11009 Unspecified pterygium of unspecified eye: Secondary | ICD-10-CM | POA: Diagnosis not present

## 2013-05-11 DIAGNOSIS — H11069 Recurrent pterygium of unspecified eye: Secondary | ICD-10-CM | POA: Diagnosis not present

## 2013-05-17 DIAGNOSIS — Z4889 Encounter for other specified surgical aftercare: Secondary | ICD-10-CM | POA: Diagnosis not present

## 2013-05-17 DIAGNOSIS — H11009 Unspecified pterygium of unspecified eye: Secondary | ICD-10-CM | POA: Diagnosis not present

## 2013-06-08 ENCOUNTER — Ambulatory Visit (INDEPENDENT_AMBULATORY_CARE_PROVIDER_SITE_OTHER): Payer: Medicare Other | Admitting: Emergency Medicine

## 2013-06-08 ENCOUNTER — Ambulatory Visit: Payer: Medicare Other

## 2013-06-08 VITALS — BP 124/80 | HR 60 | Temp 98.2°F | Resp 16 | Ht 64.25 in | Wt 144.0 lb

## 2013-06-08 DIAGNOSIS — K8020 Calculus of gallbladder without cholecystitis without obstruction: Secondary | ICD-10-CM | POA: Diagnosis not present

## 2013-06-08 DIAGNOSIS — K219 Gastro-esophageal reflux disease without esophagitis: Secondary | ICD-10-CM | POA: Diagnosis not present

## 2013-06-08 DIAGNOSIS — Z125 Encounter for screening for malignant neoplasm of prostate: Secondary | ICD-10-CM | POA: Diagnosis not present

## 2013-06-08 DIAGNOSIS — K8000 Calculus of gallbladder with acute cholecystitis without obstruction: Secondary | ICD-10-CM

## 2013-06-08 DIAGNOSIS — F17200 Nicotine dependence, unspecified, uncomplicated: Secondary | ICD-10-CM | POA: Diagnosis not present

## 2013-06-08 DIAGNOSIS — R109 Unspecified abdominal pain: Secondary | ICD-10-CM

## 2013-06-08 DIAGNOSIS — F1920 Other psychoactive substance dependence, uncomplicated: Secondary | ICD-10-CM | POA: Insufficient documentation

## 2013-06-08 DIAGNOSIS — E785 Hyperlipidemia, unspecified: Secondary | ICD-10-CM | POA: Diagnosis not present

## 2013-06-08 LAB — POCT CBC
GRANULOCYTE PERCENT: 41.7 % (ref 37–80)
HEMATOCRIT: 48.1 % (ref 43.5–53.7)
HEMOGLOBIN: 15.6 g/dL (ref 14.1–18.1)
Lymph, poc: 2.4 (ref 0.6–3.4)
MCH, POC: 33.3 pg — AB (ref 27–31.2)
MCHC: 32.4 g/dL (ref 31.8–35.4)
MCV: 102.8 fL — AB (ref 80–97)
MID (cbc): 0.4 (ref 0–0.9)
MPV: 9.1 fL (ref 0–99.8)
POC GRANULOCYTE: 2 (ref 2–6.9)
POC LYMPH PERCENT: 49.7 %L (ref 10–50)
POC MID %: 8.6 %M (ref 0–12)
Platelet Count, POC: 225 10*3/uL (ref 142–424)
RBC: 4.68 M/uL — AB (ref 4.69–6.13)
RDW, POC: 13.4 %
WBC: 4.8 10*3/uL (ref 4.6–10.2)

## 2013-06-08 LAB — LIPID PANEL
CHOL/HDL RATIO: 3.9 ratio
Cholesterol: 201 mg/dL — ABNORMAL HIGH (ref 0–200)
HDL: 52 mg/dL (ref >39)
LDL CALC: 126 mg/dL — AB (ref 0–99)
Triglycerides: 115 mg/dL (ref <150)
VLDL: 23 mg/dL (ref 0–40)

## 2013-06-08 LAB — POCT URINALYSIS DIPSTICK
BILIRUBIN UA: NEGATIVE
Blood, UA: NEGATIVE
Glucose, UA: NEGATIVE
Ketones, UA: NEGATIVE
LEUKOCYTES UA: NEGATIVE
NITRITE UA: NEGATIVE
PH UA: 6
Spec Grav, UA: >=1.03
Urobilinogen, UA: 0.2

## 2013-06-08 LAB — COMPREHENSIVE METABOLIC PANEL
ALBUMIN: 4.3 g/dL (ref 3.5–5.2)
ALK PHOS: 57 U/L (ref 39–117)
ALT: 17 U/L (ref 0–53)
AST: 20 U/L (ref 0–37)
BUN: 11 mg/dL (ref 6–23)
CO2: 27 mEq/L (ref 19–32)
CREATININE: 0.85 mg/dL (ref 0.50–1.35)
Calcium: 8.9 mg/dL (ref 8.4–10.5)
Chloride: 104 mEq/L (ref 96–112)
Glucose, Bld: 116 mg/dL — ABNORMAL HIGH (ref 70–99)
POTASSIUM: 4.3 meq/L (ref 3.5–5.3)
Sodium: 141 mEq/L (ref 135–145)
Total Bilirubin: 0.6 mg/dL (ref 0.2–1.2)
Total Protein: 7.1 g/dL (ref 6.0–8.3)

## 2013-06-08 LAB — PSA, MEDICARE: PSA: 0.94 ng/mL (ref <=4.00)

## 2013-06-08 LAB — IFOBT (OCCULT BLOOD): IMMUNOLOGICAL FECAL OCCULT BLOOD TEST: NEGATIVE

## 2013-06-08 LAB — LIPASE: Lipase: 19 U/L (ref 0–75)

## 2013-06-08 MED ORDER — OMEPRAZOLE 40 MG PO CPDR: 40.0000 mg | DELAYED_RELEASE_CAPSULE | Freq: Every day | ORAL | Status: DC

## 2013-06-08 NOTE — Progress Notes (Signed)
Subjective:    Patient ID: Harry Arellano, male    DOB: 03-16-1946, 68 y.o.   MRN: [id number] This chart was scribed for Arlyss Queen, MD by Rolanda Lundborg, ED Scribe. This patient was seen in room 5 and the patient's care was started at 10:36 AM.  Chief Complaint  Patient presents with  . Follow-up    wants cholesterol checked and wants to talk about digestion burps alot and has burny stuff come up    HPI HPI Comments: Harry Arellano is a 68 y.o. male with a h/o hyperglycemia and gall stones who presents to the [facility] for a cholesterol check. He had it checked last year and it was normal but states he wants it rechecked because he has "not been feeling right". He reports unchanged indigestion after meals with nausea but no vomiting. He reports intermittent dizziness. He denies CP, abdominal pain, dysuria. He has had 2 endoscopies. He quit smoking 2 years ago. He has 4-5 bottles of beer per day.  PCP - No primary provider on file.   Past Medical History  Diagnosis Date  . Smoker    Current Outpatient Prescriptions on File Prior to Visit  Medication Sig Dispense Refill  . omeprazole (PRILOSEC) 40 MG capsule Take 1 capsule (40 mg total) by mouth daily.  30 capsule  3  . ibuprofen (ADVIL,MOTRIN) 200 MG tablet Take 200-800 mg by mouth every 6 (six) hours as needed. Headache or pain      . meclizine (ANTIVERT) 25 MG tablet Take 1 tablet (25 mg total) by mouth 3 (three) times daily as needed.  30 tablet  0  . ondansetron (ZOFRAN) 8 MG tablet Take 1 tablet (8 mg total) by mouth every 8 (eight) hours as needed for nausea.  15 tablet  0  . triamcinolone cream (KENALOG) 0.1 % Apply topically 2 (two) times daily.  30 g  0   No current facility-administered medications on file prior to visit.   Allergies  Allergen Reactions  . Penicillins Other (See Comments)    Pass out      Review of Systems  Cardiovascular: Negative for chest pain.  Gastrointestinal: Positive for nausea.  Negative for vomiting and abdominal pain.  Genitourinary: Negative for dysuria.  Neurological: Positive for dizziness.       Objective:   Physical Exam CONSTITUTIONAL: Well developed/well nourished HEAD: Normocephalic/atraumatic EYES: EOMI/PERRL ENMT: Mucous membranes moist NECK: supple no meningeal signs SPINE:entire spine nontender CV: S1/S2 noted, no murmurs/rubs/gallops noted LUNGS: Lungs are clear to auscultation bilaterally, no apparent distress ABDOMEN: soft, nontender, no rebound or guarding GU:no cva tenderness NEURO: Pt is awake/alert, moves all extremitiesx4 EXTREMITIES: pulses normal, full ROM SKIN: warm, color normal. Vitiligo on his hands PSYCH: no abnormalities of mood noted  Filed Vitals:   06/08/13 0947  BP: 124/80  Pulse: 60  Temp: 98.2 F (36.8 C)  TempSrc: Oral  Resp: 16  Height: 5' 4.25" (1.632 m)  Weight: 144 lb (65.318 kg)  SpO2: 97%   [facility] reading (PRIMARY) by  Dr. Everlene Farrier no acute disease Results for orders placed in visit on 06/08/13  POCT CBC      Result Value Ref Range   WBC 4.8  4.6 - 10.2 K/uL   Lymph, poc 2.4  0.6 - 3.4   POC LYMPH PERCENT 49.7  10 - 50 %L   MID (cbc) 0.4  0 - 0.9   POC MID % 8.6  0 - 12 %M   POC  Granulocyte 2.0  2 - 6.9   Granulocyte percent 41.7  37 - 80 %G   RBC 4.68 (*) 4.69 - 6.13 M/uL   Hemoglobin 15.6  14.1 - 18.1 g/dL   HCT, POC 48.1  43.5 - 53.7 %   MCV 102.8 (*) 80 - 97 fL   MCH, POC 33.3 (*) 27 - 31.2 pg   MCHC 32.4  31.8 - 35.4 g/dL   RDW, POC 13.4     Platelet Count, POC 225  142 - 424 K/uL   MPV 9.1  0 - 99.8 fL  IFOBT (OCCULT BLOOD)      Result Value Ref Range   IFOBT Negative    POCT URINALYSIS DIPSTICK      Result Value Ref Range   Color, UA yellow     Clarity, UA clear     Glucose, UA neg     Bilirubin, UA neg     Ketones, UA neg     Spec Grav, UA >=1.030     Blood, UA neg     pH, UA 6.0     Protein, UA trace     Urobilinogen, UA 0.2     Nitrite, UA neg     Leukocytes, UA Negative            Assessment & Plan:  Patient is to decrease his alcohol intake. I refilled his PPI. Referral made back to Dr. Penelope Coop. He is supposed to have a repeat colonoscopy in 3 years but it has been 5 years now. He may need a repeat endoscopy. I will also inform them he did have a gallstone on his last ultrasound  **Disclaimer: This note was dictated with voice recognition software. Similar sounding words can inadvertently be transcribed and this note may contain transcription errors which may not have been corrected upon publication of note.**  I personally performed the services described in this documentation, which was scribed in my presence. The recorded information has been reviewed and is accurate.

## 2013-06-22 DIAGNOSIS — R143 Flatulence: Secondary | ICD-10-CM | POA: Diagnosis not present

## 2013-06-22 DIAGNOSIS — K219 Gastro-esophageal reflux disease without esophagitis: Secondary | ICD-10-CM | POA: Diagnosis not present

## 2013-06-22 DIAGNOSIS — R141 Gas pain: Secondary | ICD-10-CM | POA: Diagnosis not present

## 2014-08-13 ENCOUNTER — Other Ambulatory Visit: Payer: Self-pay

## 2014-08-13 DIAGNOSIS — K219 Gastro-esophageal reflux disease without esophagitis: Secondary | ICD-10-CM

## 2014-08-13 MED ORDER — OMEPRAZOLE 40 MG PO CPDR: 40.0000 mg | DELAYED_RELEASE_CAPSULE | Freq: Every day | ORAL | Status: DC

## 2014-08-13 NOTE — Telephone Encounter (Signed)
[facility] called and asked for VO for RF of omeprazole. Gave 1 mos RF w/note "pt needs OV" for more.

## 2014-10-08 ENCOUNTER — Other Ambulatory Visit: Payer: Self-pay | Admitting: Emergency Medicine

## 2014-10-24 DIAGNOSIS — H2513 Age-related nuclear cataract, bilateral: Secondary | ICD-10-CM | POA: Diagnosis not present

## 2014-10-24 DIAGNOSIS — H11002 Unspecified pterygium of left eye: Secondary | ICD-10-CM | POA: Diagnosis not present

## 2014-10-24 DIAGNOSIS — H02003 Unspecified entropion of right eye, unspecified eyelid: Secondary | ICD-10-CM | POA: Diagnosis not present

## 2014-11-22 DIAGNOSIS — H02831 Dermatochalasis of right upper eyelid: Secondary | ICD-10-CM | POA: Diagnosis not present

## 2014-11-22 DIAGNOSIS — H02002 Unspecified entropion of right lower eyelid: Secondary | ICD-10-CM | POA: Diagnosis not present

## 2014-11-22 DIAGNOSIS — H02834 Dermatochalasis of left upper eyelid: Secondary | ICD-10-CM | POA: Diagnosis not present

## 2014-11-29 ENCOUNTER — Ambulatory Visit (INDEPENDENT_AMBULATORY_CARE_PROVIDER_SITE_OTHER): Payer: Medicare Other | Admitting: Physician Assistant

## 2014-11-29 VITALS — BP 132/78 | HR 58 | Temp 98.5°F | Resp 16 | Ht 65.0 in | Wt 144.0 lb

## 2014-11-29 DIAGNOSIS — Z23 Encounter for immunization: Secondary | ICD-10-CM | POA: Diagnosis not present

## 2014-11-29 DIAGNOSIS — Z1389 Encounter for screening for other disorder: Secondary | ICD-10-CM | POA: Diagnosis not present

## 2014-11-29 DIAGNOSIS — Z1211 Encounter for screening for malignant neoplasm of colon: Secondary | ICD-10-CM

## 2014-11-29 DIAGNOSIS — K219 Gastro-esophageal reflux disease without esophagitis: Secondary | ICD-10-CM | POA: Diagnosis not present

## 2014-11-29 DIAGNOSIS — Z0000 Encounter for general adult medical examination without abnormal findings: Secondary | ICD-10-CM

## 2014-11-29 LAB — POCT CBC
Granulocyte percent: 56.2 %G (ref 37–80)
HCT, POC: 48.7 % (ref 43.5–53.7)
HEMOGLOBIN: 14.9 g/dL (ref 14.1–18.1)
Lymph, poc: 2.7 (ref 0.6–3.4)
MCH, POC: 30 pg (ref 27–31.2)
MCHC: 30.7 g/dL — AB (ref 31.8–35.4)
MCV: 97.8 fL — AB (ref 80–97)
MID (cbc): 0.2 (ref 0–0.9)
MPV: 7.5 fL (ref 0–99.8)
POC GRANULOCYTE: 3.7 (ref 2–6.9)
POC LYMPH PERCENT: 40.9 %L (ref 10–50)
POC MID %: 2.9 % (ref 0–12)
Platelet Count, POC: 229 10*3/uL (ref 142–424)
RBC: 4.98 M/uL (ref 4.69–6.13)
RDW, POC: 13.5 %
WBC: 6.6 10*3/uL (ref 4.6–10.2)

## 2014-11-29 LAB — POCT URINALYSIS DIPSTICK
BILIRUBIN UA: NEGATIVE
GLUCOSE UA: NEGATIVE
KETONES UA: NEGATIVE
LEUKOCYTES UA: NEGATIVE
Nitrite, UA: NEGATIVE
PROTEIN UA: NEGATIVE
Spec Grav, UA: 1.025
Urobilinogen, UA: 0.2
pH, UA: 5.5

## 2014-11-29 LAB — POCT GLYCOSYLATED HEMOGLOBIN (HGB A1C): Hemoglobin A1C: 5.3

## 2014-11-29 MED ORDER — OMEPRAZOLE 40 MG PO CPDR: 40.0000 mg | DELAYED_RELEASE_CAPSULE | Freq: Every day | ORAL | Status: DC

## 2014-11-29 MED ORDER — ZOSTER VACCINE LIVE 19400 UNT/0.65ML ~~LOC~~ SOLR: 0.6500 mL | Freq: Once | SUBCUTANEOUS | Status: DC

## 2014-11-29 NOTE — Progress Notes (Signed)
11/29/2014 at 8:53 PM  Harry Arellano / DOB: 1945/05/05 / MRN: [id number]  The patient has NSAID-associated gastropathy; Nicotine addiction; GERD (gastroesophageal reflux disease); Language barrier; and Substance dependence on his problem list.  SUBJECTIVE  Harry Arellano is a 69 y.o. well appearing male presenting for the chief complaint of a headache that occurred once last Sunday and has subsided since. He has a Optometrist with him and he says he wants his blood to be checked for anything that could be wrong. He quit smoking 3 years ago.  He drinks 4 beers per day.  Denies ilicits. He has had two colonoscopies in the past, which are not in [facility].  Per Dr. Perfecto Kingdom last note he is in need of another colonoscopy. He has never had     Immunization History  Administered Date(s) Administered  . Influenza Split 01/25/2012  . Pneumococcal Conjugate-13 11/29/2014  . Td 11/29/2014    He  has a past medical history of Smoker.    Medications reviewed and updated by myself where necessary, and exist elsewhere in the encounter.   Harry Arellano is allergic to penicillins. He  reports that he has quit smoking. He does not have any smokeless tobacco history on file. He reports that he drinks about 1.8 oz of alcohol per week. He reports that he does not use illicit drugs. He  has no sexual activity history on file. The patient  has no past surgical history on file.  His family history is not on file.  Review of Systems  Constitutional: Negative for fever and chills.  Respiratory: Negative for cough.   Cardiovascular: Negative for chest pain.  Gastrointestinal: Negative for vomiting, abdominal pain and diarrhea.  Genitourinary: Negative for dysuria.  Musculoskeletal: Negative for myalgias.  Skin: Negative for itching and rash.  Neurological: Negative for dizziness and headaches.    OBJECTIVE  His  height is 5\' 5"  (1.651 m) and weight is 144 lb (65.318 kg). His oral temperature is 98.5 F (36.9 C). His blood  pressure is 132/78 and his pulse is 58. His respiration is 16 and oxygen saturation is 98%.  The patient's body mass index is 23.96 kg/(m^2).  Physical Exam  Vitals reviewed. Constitutional: He is oriented to person, place, and time. He appears well-developed. No distress.  Eyes: EOM are normal. Pupils are equal, round, and reactive to light. No scleral icterus.  Neck: Normal range of motion.  Cardiovascular: Normal rate and regular rhythm.   Respiratory: Effort normal and breath sounds normal.  GI: He exhibits no distension.  Musculoskeletal: Normal range of motion.  Neurological: He is alert and oriented to person, place, and time. No cranial nerve deficit.  Skin: Skin is warm and dry. No rash noted. He is not diaphoretic.  Psychiatric: He has a normal mood and affect.    Results for orders placed or performed in visit on 11/29/14 (from the past 24 hour(s))  POCT CBC     Status: Abnormal   Collection Time: 11/29/14  5:47 PM  Result Value Ref Range   WBC 6.6 4.6 - 10.2 K/uL   Lymph, poc 2.7 0.6 - 3.4   POC LYMPH PERCENT 40.9 10 - 50 %L   MID (cbc) 0.2 0 - 0.9   POC MID % 2.9 0 - 12 %M   POC Granulocyte 3.7 2 - 6.9   Granulocyte percent 56.2 37 - 80 %G   RBC 4.98 4.69 - 6.13 M/uL   Hemoglobin 14.9 14.1 - 18.1 g/dL  HCT, POC 48.7 43.5 - 53.7 %   MCV 97.8 (A) 80 - 97 fL   MCH, POC 30.0 27 - 31.2 pg   MCHC 30.7 (A) 31.8 - 35.4 g/dL   RDW, POC 13.5 %   Platelet Count, POC 229 142 - 424 K/uL   MPV 7.5 0 - 99.8 fL  POCT glycosylated hemoglobin (Hb A1C)     Status: None   Collection Time: 11/29/14  5:47 PM  Result Value Ref Range   Hemoglobin A1C 5.3   POCT urinalysis dipstick     Status: None   Collection Time: 11/29/14  5:47 PM  Result Value Ref Range   Color, UA yellow    Clarity, UA clear    Glucose, UA neg    Bilirubin, UA neg    Ketones, UA neg    Spec Grav, UA 1.025    Blood, UA trace    pH, UA 5.5    Protein, UA neg    Urobilinogen, UA 0.2    Nitrite, UA neg     Leukocytes, UA Negative Negative    ASSESSMENT & PLAN  Harry Arellano was seen today for headache, medication refill and pt requests to have blood and urine tested.  Diagnoses and all orders for this visit:  Annual physical exam  Need for prophylactic vaccination with combined diphtheria-tetanus-pertussis (DTP) vaccine -     Td vaccine greater than or equal to 7yo preservative free IM  Need for prophylactic vaccination against Streptococcus pneumoniae (pneumococcus) -     Pneumococcal conjugate vaccine 13-valent  Need for shingles vaccine  Special screening for malignant neoplasms, colon -     Ambulatory referral to Gastroenterology  Screening for nephropathy -     POCT CBC -     POCT glycosylated hemoglobin (Hb A1C) -     POCT urinalysis dipstick -     Comprehensive metabolic panel  Gastroesophageal reflux disease, esophagitis presence not specified -     omeprazole (PRILOSEC) 40 MG capsule; Take 1 capsule (40 mg total) by mouth daily.  Other orders -     zoster vaccine live, PF, (ZOSTAVAX) 55374 UNT/0.65ML injection; Inject 19,400 Units into the skin once.    The patient was advised to call or come back to clinic if he does not see an improvement in symptoms, or worsens with the above plan.   Philis Fendt, MHS, PA-C [facility] 11/29/2014 8:53 PM

## 2014-11-30 LAB — COMPREHENSIVE METABOLIC PANEL
ALBUMIN: 4.2 g/dL (ref 3.6–5.1)
ALK PHOS: 54 U/L (ref 40–115)
ALT: 16 U/L (ref 9–46)
AST: 21 U/L (ref 10–35)
BILIRUBIN TOTAL: 1.2 mg/dL (ref 0.2–1.2)
BUN: 17 mg/dL (ref 7–25)
CO2: 26 mmol/L (ref 20–31)
Calcium: 9 mg/dL (ref 8.6–10.3)
Chloride: 108 mmol/L (ref 98–110)
Creat: 0.87 mg/dL (ref 0.70–1.25)
GLUCOSE: 96 mg/dL (ref 65–99)
POTASSIUM: 4.9 mmol/L (ref 3.5–5.3)
Sodium: 142 mmol/L (ref 135–146)
Total Protein: 7.3 g/dL (ref 6.1–8.1)

## 2014-12-01 NOTE — Progress Notes (Signed)
  Medical screening examination/treatment/procedure(s) were performed by non-physician practitioner and as supervising physician I was immediately available for consultation/collaboration.     

## 2014-12-02 ENCOUNTER — Other Ambulatory Visit: Payer: Self-pay | Admitting: Physician Assistant

## 2014-12-02 DIAGNOSIS — D7589 Other specified diseases of blood and blood-forming organs: Secondary | ICD-10-CM

## 2014-12-02 MED ORDER — VITAMIN B COMPLEX PO TABS: ORAL_TABLET | ORAL | Status: AC

## 2014-12-09 ENCOUNTER — Encounter: Payer: Self-pay | Admitting: Family Medicine

## 2014-12-10 ENCOUNTER — Telehealth: Payer: Self-pay | Admitting: *Deleted

## 2014-12-28 ENCOUNTER — Telehealth: Payer: Self-pay

## 2014-12-28 NOTE — Telephone Encounter (Signed)
Received records from [facility] forwarded 26 pages to GI Department 12/28/14 [facility].

## 2015-02-11 ENCOUNTER — Encounter: Payer: Self-pay | Admitting: Physician Assistant

## 2015-03-27 NOTE — Telephone Encounter (Signed)
Error

## 2015-11-04 DIAGNOSIS — H25811 Combined forms of age-related cataract, right eye: Secondary | ICD-10-CM | POA: Diagnosis not present

## 2015-11-04 DIAGNOSIS — H25812 Combined forms of age-related cataract, left eye: Secondary | ICD-10-CM | POA: Diagnosis not present

## 2015-11-04 DIAGNOSIS — H04123 Dry eye syndrome of bilateral lacrimal glands: Secondary | ICD-10-CM | POA: Diagnosis not present

## 2015-12-04 DIAGNOSIS — H25812 Combined forms of age-related cataract, left eye: Secondary | ICD-10-CM | POA: Diagnosis not present

## 2015-12-04 DIAGNOSIS — H4302 Vitreous prolapse, left eye: Secondary | ICD-10-CM | POA: Diagnosis not present

## 2015-12-04 DIAGNOSIS — H2512 Age-related nuclear cataract, left eye: Secondary | ICD-10-CM | POA: Diagnosis not present

## 2015-12-05 DIAGNOSIS — H4052X3 Glaucoma secondary to other eye disorders, left eye, severe stage: Secondary | ICD-10-CM | POA: Diagnosis not present

## 2015-12-05 DIAGNOSIS — H2511 Age-related nuclear cataract, right eye: Secondary | ICD-10-CM | POA: Diagnosis not present

## 2015-12-05 DIAGNOSIS — H4302 Vitreous prolapse, left eye: Secondary | ICD-10-CM | POA: Diagnosis not present

## 2015-12-05 DIAGNOSIS — H59022 Cataract (lens) fragments in eye following cataract surgery, left eye: Secondary | ICD-10-CM | POA: Diagnosis not present

## 2015-12-05 DIAGNOSIS — H2702 Aphakia, left eye: Secondary | ICD-10-CM | POA: Diagnosis not present

## 2015-12-05 DIAGNOSIS — H25812 Combined forms of age-related cataract, left eye: Secondary | ICD-10-CM | POA: Diagnosis not present

## 2016-01-27 DIAGNOSIS — Z09 Encounter for follow-up examination after completed treatment for conditions other than malignant neoplasm: Secondary | ICD-10-CM | POA: Diagnosis not present

## 2016-01-27 DIAGNOSIS — H35372 Puckering of macula, left eye: Secondary | ICD-10-CM | POA: Diagnosis not present

## 2016-01-29 DIAGNOSIS — Z0000 Encounter for general adult medical examination without abnormal findings: Secondary | ICD-10-CM | POA: Diagnosis not present

## 2016-01-29 DIAGNOSIS — K219 Gastro-esophageal reflux disease without esophagitis: Secondary | ICD-10-CM | POA: Diagnosis not present

## 2016-03-26 ENCOUNTER — Ambulatory Visit (INDEPENDENT_AMBULATORY_CARE_PROVIDER_SITE_OTHER): Payer: Medicare HMO | Admitting: Urgent Care

## 2016-03-26 VITALS — BP 142/82 | HR 74 | Temp 98.7°F | Resp 16 | Ht 65.0 in | Wt 141.0 lb

## 2016-03-26 DIAGNOSIS — R030 Elevated blood-pressure reading, without diagnosis of hypertension: Secondary | ICD-10-CM

## 2016-03-26 DIAGNOSIS — R42 Dizziness and giddiness: Secondary | ICD-10-CM

## 2016-03-26 DIAGNOSIS — F1010 Alcohol abuse, uncomplicated: Secondary | ICD-10-CM | POA: Diagnosis not present

## 2016-03-26 DIAGNOSIS — R69 Illness, unspecified: Secondary | ICD-10-CM | POA: Diagnosis not present

## 2016-03-26 LAB — POCT GLYCOSYLATED HEMOGLOBIN (HGB A1C): Hemoglobin A1C: 5.3

## 2016-03-26 LAB — POC MICROSCOPIC URINALYSIS (UMFC): Mucus: ABSENT

## 2016-03-26 LAB — POCT URINALYSIS DIP (MANUAL ENTRY)
BILIRUBIN UA: NEGATIVE
GLUCOSE UA: NEGATIVE
Leukocytes, UA: NEGATIVE
Nitrite, UA: NEGATIVE
Protein Ur, POC: NEGATIVE
RBC UA: NEGATIVE
SPEC GRAV UA: 1.025
UROBILINOGEN UA: 2
pH, UA: 5.5

## 2016-03-26 LAB — POCT CBC
Granulocyte percent: 62.6 %G (ref 37–80)
HCT, POC: 47.2 % (ref 43.5–53.7)
HEMOGLOBIN: 16.6 g/dL (ref 14.1–18.1)
LYMPH, POC: 1.7 (ref 0.6–3.4)
MCH, POC: 34.5 pg — AB (ref 27–31.2)
MCHC: 35.2 g/dL (ref 31.8–35.4)
MCV: 98.1 fL — AB (ref 80–97)
MID (cbc): 0.3 (ref 0–0.9)
MPV: 7.4 fL (ref 0–99.8)
POC Granulocyte: 3.4 (ref 2–6.9)
POC LYMPH PERCENT: 32.4 %L (ref 10–50)
POC MID %: 5 %M (ref 0–12)
Platelet Count, POC: 240 10*3/uL (ref 142–424)
RBC: 4.81 M/uL (ref 4.69–6.13)
RDW, POC: 13 %
WBC: 5.4 10*3/uL (ref 4.6–10.2)

## 2016-03-26 LAB — GLUCOSE, POCT (MANUAL RESULT ENTRY): POC GLUCOSE: 84 mg/dL (ref 70–99)

## 2016-03-26 NOTE — Patient Instructions (Signed)
Chng m?t (Dizziness) Chng m?t l m?t v?n ?? ph? bi?n. ? l c?m gic khng ?n ??nh ho?c chong vng. Qu v? c th? c?m th?y nh? s?p ng?t. Chng m?t c th? d?n ??n ch?n th??ng n?u qu v? v?p ho?c t ng. B?t k? ai c?ng c th? b? chng m?t nh?ng chng m?t ph? bi?n h?n ? ng??i l?n. Tnh tr?ng ny c th? do m?t s? nguyn nhn, bao g?m dng thu?c, b? m?t n??c, ho?c b? ?m. H??NG D?N CH?M [facility]. [facility] T?I NH Th?c hi?n nh?ng b??c sau c th? gip gi?m tnh tr?ng ny. ?n v u?ng   U?ng ?? n??c ?? gi? cho n??c ti?u trong ho?c c mu vng nh?t. Vi?c ny gi? cho c? th? qu v? khng b? m?t n??c. C? g?ng u?ng nhi?u dung d?ch trong su?t, ch?ng h?n nh? n??c.  Khng u?ng r??u.  H?n ch? dng caffein n?u c ch? d?n c?a chuyn gia ch?m [place] s?c kh?e.  H?n ch? dng mu?i n?u c ch? d?n c?a chuyn gia ch?m [place] s?c kh?e. Ho?t ??ng   Trnh cc ??ng tc nhanh.  T? ??ng d?y th?t ch?m v v?ng ch?c cho ??n khi qu v? c?m th?y khng c v?n ?? g.  Vo bu?i sng, ??u tin l ng?i d?y ? bn c?nh gi??ng. Khi qu v? c?m th?y ?n ? t? th? ny, hy ??ng d?y ch?m d?y trong khi n?m l?y m?t ci g ? cho ??n khi qu v? th?y th?ng b?ng.  C? ??ng chn th??ng xuyn n?u qu v? c?n ??ng ? m?t n?i trong m?t th?i gian di. Co v th? gin cc c? b?p ? chn qu v? khi ??ng.  Khng li xe ho?c v?n hnh my mc n?ng n?u qu v? c?m th?y chng m?t.  Trnh ci ng??i n?u qu v? c?m th?y chng m?t. ?? cc v?t d?ng trong nh sao cho c th? d? dng v?i t?i chng m khng c?n ci ng??i. L?i s?ng   Khng s? d?ng b?t c? cc s?n ph?m thu?c l no, bao g?m thu?c l d?ng ht, thu?c l d?ng nhai ho?c thu?c l ?i?n t?. N?u qu v? c?n gip ?? ?? cai thu?c, hy h?i chuyn gia ch?m [place] s?c kh?e.  C? g?ng gi?m c?ng th?ng, ch?ng h?n nh? t?p yoga ho?c thi?n. Ni v?i chuyn gia ch?m [place] s?c kh?e n?u qu v? c?n gip ??. H??ng d?n chung   Theo di tnh tr?ng chng m?t c?a qu v? ?? pht hi?n b?t k? thay ??i no.  Ch? s? d?ng thu?c theo ch? d?n c?a chuyn gia  ch?m [place] s?c kh?e. Ni chuy?n v?i chuyn gia ch?m [place] s?c kh?e n?u qu v? ngh? r?ng tnh tr?ng chng m?t l do vi?c qu v? dng thu?c gy ra.  Ni v?i m?t ng??i b?n ho?c m?t ng??i trong gia ?nh r?ng qu v? ?ang b? chng m?t. N?u h? nh?n th?y qu v? c thay ??i hnh vi, hy b?o h? g?i cho chuyn gia ch?m [place] s?c kh?e.  Tun th? t?t c? cc cu?c h?n khm l?i theo ch? d?n c?a chuyn gia ch?m [place] s?c kh?e. ?i?u ny c vai tr quan tr?ng. ?I KHM N?U:  Tnh tr?ng chng m?t khng m?t ?i.  Qu v? b? hoa m?t ho?c chng m?t n?ng h?n.  Qu v? c?m th?y bu?n nn.  Qu v? b? gi?m thnh l?c.  Qu v? c cc tri?u ch?ng m?i.  Qu v? khng ??ng v?ng ???c ho?c qu v? c?m th?y nh? c?n phng ?  ang quay. NGAY L?P T?C ?I KHM N?U:  Qu v? nn ho?c tiu ch?y v khng th? ?n ho?c u?ng ???c th? g.  Qu v? g?p kh kh?n trong vi?c ni, ?i b?, nu?t ho?c s? d?ng cnh tay, bn tay ho?c chn.  Qu v? c?m th?y y?u ton thn.  Qu v? suy ngh? khng r rng ho?c kh ??t cu. M?t ng??i b?n ho?c ng??i trong gia ?nh c th? nh?n th?y ?i?u ny.  Qu v? b? ?au ng?c, ?au b?ng, kh th? ho?c v m? hi.  Th? l?c c?a qu v? thay ??i.  Qu v? th?y b?t k? ch? ch?y mu no.  Qu v? b? ?au ??u.  Qu v? b? ?au c? ho?c c? c?ng.  Qu v? b? s?t. Thng tin ny khng nh?m m?c ?ch thay th? cho l?i khuyn m chuyn gia ch?m [place] s?c kh?e ni v?i qu v?. Hy b?o ??m qu v? ph?i th?o lu?n b?t k? v?n ?? g m qu v? c v?i chuyn gia ch?m [place] s?c kh?e c?a qu v?. Document Released: 03/26/2011 Document Revised: 08/21/2014 Document Reviewed: 04/02/2014 Elsevier Interactive Patient Education  2017 [employer].

## 2016-03-26 NOTE — Progress Notes (Signed)
MRN: [id number] DOB: [birth year]  Subjective:   Harry Arellano is a 70 y.o. male presenting for chief complaint of Dizziness (shakiness inside body started 1-2 hours ago)  Reports sudden onset of dizziness today associated with body aches, chills, bitter taste in mouth. Patient was working at the time, washing dishes. Has not tried taking any medications. Denies headache, congestion, sore throat, cough, chest pain, n/v, abdominal pain, dysuria, hematuria, urinary frequency. Drinks 4-5 bottles of beer per day. Denies smoking cigarettes.  Harry Arellano has a current medication list which includes the following prescription(s): vitamin b complex, ibuprofen, and omeprazole. Also is allergic to penicillins.  Harry Arellano  has a past medical history of Smoker. Also denies past surgical history.    Objective:   Vitals: BP (!) 142/82 (BP Location: Right Arm, Patient Position: Sitting, Cuff Size: Small)   Pulse 74   Temp 98.7 F (37.1 C) (Oral)   Resp 16   Ht 5\' 5"  (1.651 m)   Wt 141 lb (64 kg)   SpO2 96%   BMI 23.46 kg/m   Physical Exam  Constitutional: He is oriented to person, place, and time. He appears well-developed and well-nourished.  HENT:  Mouth/Throat: Oropharynx is clear and moist.  Eyes: Pupils are equal, round, and reactive to light. No scleral icterus.  Neck: Normal range of motion. Neck supple. No thyromegaly present.  Cardiovascular: Normal rate, regular rhythm and intact distal pulses.  Exam reveals no gallop and no friction rub.   No murmur heard. Pulmonary/Chest: No respiratory distress. He has no wheezes. He has no rales.  Musculoskeletal: He exhibits no edema.  Neurological: He is alert and oriented to person, place, and time.  Skin: Skin is warm and dry.   Results for orders placed or performed in visit on 03/26/16 (from the past 24 hour(s))  POCT urinalysis dipstick     Status: Abnormal   Collection Time: 03/26/16  3:50 PM  Result Value Ref Range   Color, UA yellow yellow   Clarity, UA clear clear   Glucose, UA negative negative   Bilirubin, UA small (A) negative   Ketones, POC UA negative negative   Spec Grav, UA 1.025    Blood, UA negative negative   pH, UA 5.5    Protein Ur, POC negative negative   Urobilinogen, UA 2.0    Nitrite, UA Negative Negative   Leukocytes, UA Negative Negative  POCT CBC     Status: Abnormal   Collection Time: 03/26/16  3:51 PM  Result Value Ref Range   WBC 5.4 4.6 - 10.2 K/uL   Lymph, poc 1.7 0.6 - 3.4   POC LYMPH PERCENT 32.4 10 - 50 %L   MID (cbc) 0.3 0 - 0.9   POC MID % 5.0 0 - 12 %M   POC Granulocyte 3.4 2 - 6.9   Granulocyte percent 62.6 37 - 80 %G   RBC 4.81 4.69 - 6.13 M/uL   Hemoglobin 16.6 14.1 - 18.1 g/dL   HCT, POC 47.2 43.5 - 53.7 %   MCV 98.1 (A) 80 - 97 fL   MCH, POC 34.5 (A) 27 - 31.2 pg   MCHC 35.2 31.8 - 35.4 g/dL   RDW, POC 13.0 %   Platelet Count, POC 240 142 - 424 K/uL   MPV 7.4 0 - 99.8 fL  POCT glucose (manual entry)     Status: None   Collection Time: 03/26/16  3:51 PM  Result Value Ref Range   POC Glucose 84 70 -  99 mg/dl  POCT glycosylated hemoglobin (Hb A1C)     Status: None   Collection Time: 03/26/16  3:53 PM  Result Value Ref Range   Hemoglobin A1C 5.3   POCT Microscopic Urinalysis ([facility])     Status: Abnormal   Collection Time: 03/26/16  3:59 PM  Result Value Ref Range   WBC,UR,HPF,POC None None WBC/hpf   RBC,UR,HPF,POC None None RBC/hpf   Bacteria None None, Too numerous to count   Mucus Absent Absent   Epithelial Cells, UR Per Microscopy Few (A) None, Too numerous to count cells/hpf   Assessment and Plan :   1. Dizziness 2. Alcohol abuse 3. Elevated blood pressure reading without diagnosis of hypertension - His daughter states that they have asked patient to cut back on his drinking. I discussed this with the patient as well. He did not agree to cutting back but verbalized understanding. I counseled on risks of alcohol abuse, need for better hydration. Patient will follow up  as needed.  Jaynee Eagles, PA-C [facility] [phone] 03/26/2016 3:15 PM

## 2016-03-27 ENCOUNTER — Encounter: Payer: Self-pay | Admitting: Urgent Care

## 2016-03-27 LAB — CMP14+EGFR
A/G RATIO: 1.5 (ref 1.2–2.2)
ALT: 13 IU/L (ref 0–44)
AST: 23 IU/L (ref 0–40)
Albumin: 4.5 g/dL (ref 3.5–4.8)
Alkaline Phosphatase: 63 IU/L (ref 39–117)
BUN / CREAT RATIO: 23 (ref 10–24)
BUN: 19 mg/dL (ref 8–27)
Bilirubin Total: 0.6 mg/dL (ref 0.0–1.2)
CALCIUM: 9 mg/dL (ref 8.6–10.2)
CO2: 22 mmol/L (ref 18–29)
Chloride: 101 mmol/L (ref 96–106)
Creatinine, Ser: 0.82 mg/dL (ref 0.76–1.27)
GFR, EST AFRICAN AMERICAN: 104 mL/min/{1.73_m2} (ref >59)
GFR, EST NON AFRICAN AMERICAN: 90 mL/min/{1.73_m2} (ref >59)
GLOBULIN, TOTAL: 3 g/dL (ref 1.5–4.5)
Glucose: 96 mg/dL (ref 65–99)
POTASSIUM: 5 mmol/L (ref 3.5–5.2)
SODIUM: 140 mmol/L (ref 134–144)
TOTAL PROTEIN: 7.5 g/dL (ref 6.0–8.5)

## 2016-04-08 DIAGNOSIS — H25811 Combined forms of age-related cataract, right eye: Secondary | ICD-10-CM | POA: Diagnosis not present

## 2016-04-08 DIAGNOSIS — Z961 Presence of intraocular lens: Secondary | ICD-10-CM | POA: Diagnosis not present

## 2016-04-08 DIAGNOSIS — H04123 Dry eye syndrome of bilateral lacrimal glands: Secondary | ICD-10-CM | POA: Diagnosis not present

## 2016-04-08 DIAGNOSIS — H02002 Unspecified entropion of right lower eyelid: Secondary | ICD-10-CM | POA: Diagnosis not present

## 2016-04-08 DIAGNOSIS — H35352 Cystoid macular degeneration, left eye: Secondary | ICD-10-CM | POA: Diagnosis not present

## 2016-04-22 DIAGNOSIS — H02002 Unspecified entropion of right lower eyelid: Secondary | ICD-10-CM | POA: Diagnosis not present

## 2016-04-29 DIAGNOSIS — H02035 Senile entropion of left lower eyelid: Secondary | ICD-10-CM | POA: Diagnosis not present

## 2016-05-13 DIAGNOSIS — H2511 Age-related nuclear cataract, right eye: Secondary | ICD-10-CM | POA: Diagnosis not present

## 2016-05-18 DIAGNOSIS — H25011 Cortical age-related cataract, right eye: Secondary | ICD-10-CM | POA: Diagnosis not present

## 2016-05-18 DIAGNOSIS — H25811 Combined forms of age-related cataract, right eye: Secondary | ICD-10-CM | POA: Diagnosis not present

## 2016-05-18 DIAGNOSIS — H2511 Age-related nuclear cataract, right eye: Secondary | ICD-10-CM | POA: Diagnosis not present

## 2016-06-01 DIAGNOSIS — H18232 Secondary corneal edema, left eye: Secondary | ICD-10-CM | POA: Diagnosis not present

## 2016-06-01 DIAGNOSIS — H59032 Cystoid macular edema following cataract surgery, left eye: Secondary | ICD-10-CM | POA: Diagnosis not present

## 2016-06-01 DIAGNOSIS — H35372 Puckering of macula, left eye: Secondary | ICD-10-CM | POA: Diagnosis not present

## 2016-06-01 DIAGNOSIS — H35352 Cystoid macular degeneration, left eye: Secondary | ICD-10-CM | POA: Diagnosis not present

## 2016-07-28 DIAGNOSIS — H35352 Cystoid macular degeneration, left eye: Secondary | ICD-10-CM | POA: Diagnosis not present

## 2016-07-28 DIAGNOSIS — H59032 Cystoid macular edema following cataract surgery, left eye: Secondary | ICD-10-CM | POA: Diagnosis not present

## 2016-07-28 DIAGNOSIS — H18232 Secondary corneal edema, left eye: Secondary | ICD-10-CM | POA: Diagnosis not present

## 2016-07-28 DIAGNOSIS — H35372 Puckering of macula, left eye: Secondary | ICD-10-CM | POA: Diagnosis not present

## 2016-07-31 DIAGNOSIS — Z86010 Personal history of colonic polyps: Secondary | ICD-10-CM | POA: Diagnosis not present

## 2016-08-11 DIAGNOSIS — Z86010 Personal history of colonic polyps: Secondary | ICD-10-CM | POA: Diagnosis not present

## 2016-08-11 DIAGNOSIS — K635 Polyp of colon: Secondary | ICD-10-CM | POA: Diagnosis not present

## 2016-08-11 DIAGNOSIS — K5730 Diverticulosis of large intestine without perforation or abscess without bleeding: Secondary | ICD-10-CM | POA: Diagnosis not present

## 2016-08-14 DIAGNOSIS — K635 Polyp of colon: Secondary | ICD-10-CM | POA: Diagnosis not present

## 2016-10-02 DIAGNOSIS — H59032 Cystoid macular edema following cataract surgery, left eye: Secondary | ICD-10-CM | POA: Diagnosis not present

## 2016-10-02 DIAGNOSIS — H18232 Secondary corneal edema, left eye: Secondary | ICD-10-CM | POA: Diagnosis not present

## 2016-10-02 DIAGNOSIS — H35372 Puckering of macula, left eye: Secondary | ICD-10-CM | POA: Diagnosis not present

## 2016-10-02 DIAGNOSIS — H35352 Cystoid macular degeneration, left eye: Secondary | ICD-10-CM | POA: Diagnosis not present

## 2016-11-11 DIAGNOSIS — Z791 Long term (current) use of non-steroidal anti-inflammatories (NSAID): Secondary | ICD-10-CM | POA: Diagnosis not present

## 2016-11-11 DIAGNOSIS — Z6822 Body mass index (BMI) 22.0-22.9, adult: Secondary | ICD-10-CM | POA: Diagnosis not present

## 2016-11-11 DIAGNOSIS — K219 Gastro-esophageal reflux disease without esophagitis: Secondary | ICD-10-CM | POA: Diagnosis not present

## 2016-11-11 DIAGNOSIS — Z9841 Cataract extraction status, right eye: Secondary | ICD-10-CM | POA: Diagnosis not present

## 2016-11-11 DIAGNOSIS — Z79899 Other long term (current) drug therapy: Secondary | ICD-10-CM | POA: Diagnosis not present

## 2016-11-11 DIAGNOSIS — H18412 Arcus senilis, left eye: Secondary | ICD-10-CM | POA: Diagnosis not present

## 2016-11-11 DIAGNOSIS — Z0000 Encounter for general adult medical examination without abnormal findings: Secondary | ICD-10-CM | POA: Diagnosis not present

## 2016-11-11 DIAGNOSIS — K08409 Partial loss of teeth, unspecified cause, unspecified class: Secondary | ICD-10-CM | POA: Diagnosis not present

## 2016-11-11 DIAGNOSIS — Z9842 Cataract extraction status, left eye: Secondary | ICD-10-CM | POA: Diagnosis not present

## 2017-06-21 DIAGNOSIS — Z961 Presence of intraocular lens: Secondary | ICD-10-CM | POA: Diagnosis not present

## 2017-06-21 DIAGNOSIS — H35352 Cystoid macular degeneration, left eye: Secondary | ICD-10-CM | POA: Diagnosis not present

## 2017-06-21 DIAGNOSIS — H35372 Puckering of macula, left eye: Secondary | ICD-10-CM | POA: Diagnosis not present

## 2017-06-21 DIAGNOSIS — H59032 Cystoid macular edema following cataract surgery, left eye: Secondary | ICD-10-CM | POA: Diagnosis not present

## 2017-06-22 DIAGNOSIS — Z961 Presence of intraocular lens: Secondary | ICD-10-CM | POA: Diagnosis not present

## 2017-06-22 DIAGNOSIS — H01021 Squamous blepharitis right upper eyelid: Secondary | ICD-10-CM | POA: Diagnosis not present

## 2017-06-22 DIAGNOSIS — H01024 Squamous blepharitis left upper eyelid: Secondary | ICD-10-CM | POA: Diagnosis not present

## 2017-06-22 DIAGNOSIS — H01022 Squamous blepharitis right lower eyelid: Secondary | ICD-10-CM | POA: Diagnosis not present

## 2017-06-22 DIAGNOSIS — H10413 Chronic giant papillary conjunctivitis, bilateral: Secondary | ICD-10-CM | POA: Diagnosis not present

## 2017-06-22 DIAGNOSIS — H01025 Squamous blepharitis left lower eyelid: Secondary | ICD-10-CM | POA: Diagnosis not present

## 2018-01-14 ENCOUNTER — Other Ambulatory Visit: Payer: Self-pay

## 2018-01-14 ENCOUNTER — Encounter: Payer: Self-pay | Admitting: Family Medicine

## 2018-01-14 ENCOUNTER — Ambulatory Visit (INDEPENDENT_AMBULATORY_CARE_PROVIDER_SITE_OTHER): Payer: Medicare HMO

## 2018-01-14 ENCOUNTER — Ambulatory Visit (INDEPENDENT_AMBULATORY_CARE_PROVIDER_SITE_OTHER): Payer: Medicare HMO | Admitting: Family Medicine

## 2018-01-14 VITALS — BP 164/70 | HR 60 | Temp 98.0°F | Ht 64.57 in | Wt 149.2 lb

## 2018-01-14 DIAGNOSIS — M25512 Pain in left shoulder: Secondary | ICD-10-CM | POA: Diagnosis not present

## 2018-01-14 DIAGNOSIS — Z1159 Encounter for screening for other viral diseases: Secondary | ICD-10-CM

## 2018-01-14 DIAGNOSIS — K219 Gastro-esophageal reflux disease without esophagitis: Secondary | ICD-10-CM | POA: Diagnosis not present

## 2018-01-14 DIAGNOSIS — R140 Abdominal distension (gaseous): Secondary | ICD-10-CM | POA: Diagnosis not present

## 2018-01-14 DIAGNOSIS — Z789 Other specified health status: Secondary | ICD-10-CM | POA: Diagnosis not present

## 2018-01-14 DIAGNOSIS — Z23 Encounter for immunization: Secondary | ICD-10-CM

## 2018-01-14 DIAGNOSIS — Z131 Encounter for screening for diabetes mellitus: Secondary | ICD-10-CM | POA: Diagnosis not present

## 2018-01-14 DIAGNOSIS — Z7289 Other problems related to lifestyle: Secondary | ICD-10-CM

## 2018-01-14 MED ORDER — OMEPRAZOLE 40 MG PO CPDR: 40.0000 mg | DELAYED_RELEASE_CAPSULE | Freq: Every day | ORAL | 1 refills | Status: DC

## 2018-01-14 NOTE — Patient Instructions (Addendum)
Flu shot and pneumonia shots given today.  Heat or ice to the sore area on your shoulder and gentle range of motion.  No medication for now until I see your blood work.  If liver tests look okay then can take Tylenol but do not take Advil Aleve or other NSAIDs.   Restart omeprazole once per day.  Try to decrease alcohol to no more than 1-2 drinks per day.  I will refer you to gastroenterologist.    Return to the clinic or go to the nearest emergency room if any of your symptoms worsen or new symptoms occur.  Please follow-up within the next 1 month for wellness exam.    If you have lab work done today you will be contacted with your lab results within the next 2 weeks.  If you have not heard from [place] then please contact us. The fastest way to get your results is to register for My Chart.   IF you received an x-ray today, you will receive an invoice from [facility]. Please contact [facility] at [phone] with questions or concerns regarding your invoice.   IF you received labwork today, you will receive an invoice from [place]. Please contact [facility] at [phone] with questions or concerns regarding your invoice.   Our billing staff will not be able to assist you with questions regarding bills from these companies.  You will be contacted with the lab results as soon as they are available. The fastest way to get your results is to activate your My Chart account. Instructions are located on the last page of this paperwork. If you have not heard from [place] regarding the results in 2 weeks, please contact this office.

## 2018-01-14 NOTE — Progress Notes (Signed)
Subjective:  By signing my name below, I, Harry Arellano, attest that this documentation has been prepared under the direction and in the presence of Merri Ray, MD. Electronically Signed: Moises Arellano, [place]. 01/14/2018 , 5:16 PM .  Patient was seen in Room 8 .   Patient ID: Harry Arellano, male    DOB: December 24, 1945, 72 y.o.   MRN: [id number] Chief Complaint  Patient presents with  . Annual Exam    CPE  . shoulder joint pain    left side   HPI Harry Arellano is a 72 y.o. male Initially for annual physical and wellness exam. He is a new patient to me. Last seen and note provided by Jaynee Eagles in Dec 2017. He is also complaining of left shoulder pain. He is a former smoker.   He had Arellano drawn this morning. At the beginning of the visit, there was some difficulty determining reason of visit today, but ultimately patient decided to discuss left shoulder pain and GERD symptoms today.   Patient's primary language is [place]; Press photographer interpreter: Lily Kocher) [phone].   GERD He has a history of GERD with NSAID associated gastropathy. He took Prilosec 40 mg qd.   Patient reports having a bloating feeling in his abdomen like there's gas stuck in there. He also notes having a sensation of something stuck in his throat, similar to heartburn. He denies nausea, vomiting, Arellano in stool, or dark tarry stools. He had stopped taking his GERD medications in April-May. When he stopped his medications, he was doing okay for the first few months, but now his symptoms are returning. He's still drinking alcohol, about 4 beers a day. He denies difficulty with swallowing foods or fluids. He denies any fevers, vomiting, or unexplained weight loss.   He mentions he had colonoscopy done about 5 years ago, and noted to repeat in 5 years; believes done at [facility].   Left Shoulder pain Patient states left shoulder pain started about 2-3 months ago, out of the blue. He notices the pain when trying to lift his  left arm above his head. He denies any known injuries or falls. He denies any neck pain. He denies trying any treatments for this.    Health maintenance He is due for colonoscopy, pneumovax, flu shot, and Hep C screening.   He mentions he had colonoscopy done about 5 years ago, and noted to repeat in 5 years; believes done at [facility].   He agrees to pneumovax and flu shot today.    Patient Active Problem List   Diagnosis Date Noted  . Substance dependence ([place]) 06/08/2013  . Language barrier 09/15/2012  . NSAID-associated gastropathy 01/25/2012  . Nicotine addiction 01/25/2012  . GERD (gastroesophageal reflux disease) 01/25/2012   Past Medical History:  Diagnosis Date  . Smoker    History reviewed. No pertinent surgical history. Allergies  Allergen Reactions  . Penicillins Other (See Comments)    Pass out    Prior to Admission medications   Medication Sig Start Date End Date Taking? Authorizing Provider  B Complex Vitamins (VITAMIN B COMPLEX) TABS Take 1 OTC tablet daily for 6 months and then return to clinic. Please limit alcohol to no more than 2 drinks per day. Patient not taking: Reported on 01/14/2018 12/02/14   Tereasa Coop, PA-C  ibuprofen (ADVIL,MOTRIN) 200 MG tablet Take 200-800 mg by mouth every 6 (six) hours as needed. Headache or pain    [provider]  omeprazole (PRILOSEC) 40 MG capsule Take  1 capsule (40 mg total) by mouth daily. Patient not taking: Reported on 01/14/2018 11/29/14   Tereasa Coop, PA-C   Social History   Socioeconomic History  . Marital status: Married    Spouse name: Not on file  . Number of children: Not on file  . Years of education: Not on file  . Highest education level: Not on file  Occupational History  . Not on file  Social Needs  . Financial resource strain: Not on file  . Food insecurity:    Worry: Not on file    Inability: Not on file  . Transportation needs:    Medical: Not on file    Non-medical: Not on  file  Tobacco Use  . Smoking status: Former Smoker    Packs/day: 0.50  . Smokeless tobacco: Never Used  Substance and Sexual Activity  . Alcohol use: Yes    Alcohol/week: 3.0 standard drinks    Types: 3 Cans of beer per week  . Drug use: No  . Sexual activity: Not on file  Lifestyle  . Physical activity:    Days per week: Not on file    Minutes per session: Not on file  . Stress: Not on file  Relationships  . Social connections:    Talks on phone: Not on file    Gets together: Not on file    Attends religious service: Not on file    Active member of club or organization: Not on file    Attends meetings of clubs or organizations: Not on file    Relationship status: Not on file  . Intimate partner violence:    Fear of current or ex partner: Not on file    Emotionally abused: Not on file    Physically abused: Not on file    Forced sexual activity: Not on file  Other Topics Concern  . Not on file  Social History Narrative  . Not on file    Review of Systems  Constitutional: Negative for appetite change, chills, fatigue, fever and unexpected weight change.  Eyes: Negative for visual disturbance.  Respiratory: Negative for cough, chest tightness and shortness of breath.   Cardiovascular: Negative for chest pain, palpitations and leg swelling.  Gastrointestinal: Positive for abdominal distention. Negative for abdominal pain, anal bleeding, Arellano in stool, nausea and vomiting.  Musculoskeletal: Positive for arthralgias. Negative for joint swelling and neck pain.  Neurological: Negative for dizziness, light-headedness and headaches.       Objective:   Physical Exam  Constitutional: He is oriented to person, place, and time. He appears well-developed and well-nourished.  HENT:  Head: Normocephalic and atraumatic.  Eyes: Pupils are equal, round, and reactive to light. EOM are normal.  Neck: No JVD present. Carotid bruit is not present.  Cardiovascular: Normal rate, regular  rhythm and normal heart sounds.  No murmur heard. Pulmonary/Chest: Effort normal and breath sounds normal. He has no rales.  Abdominal: Soft. Bowel sounds are normal. He exhibits no distension. There is no tenderness.  Musculoskeletal: He exhibits no edema.  Left shoulder: initially pointed at posterior shoulder as area of pain, and then pointed at trapezius and scapula; then localized area of pain at trapezius more than scapula and then states no pain over scapula; pain-free ROM of C-spine, [place]/AC clavicle non tender, slightly decreased internal rotation to approximately L4; also pain with abduction, but equal ROM to the right; he keeps experiencing pain in upper trapezius than actual shoulder, negative empty can, negative hawkin, negative  neer  Neurological: He is alert and oriented to person, place, and time.  Skin: Skin is warm and dry.  Psychiatric: He has a normal mood and affect.  Vitals reviewed.   Vitals:   01/14/18 1535 01/14/18 1559  BP: (!) 161/92 (!) 164/70  Pulse: 60   Temp: 98 F (36.7 C)   TempSrc: Oral   SpO2: 96%   Weight: 149 lb 3.2 oz (67.7 kg)   Height: 5' 4.57" (1.64 m)    Dg Scapula Left  Result Date: 01/14/2018 CLINICAL DATA:  left upper trapezius, left scapula pain for past 2 months. EXAM: LEFT SCAPULA - 2+ VIEWS COMPARISON:  None. FINDINGS: There is no evidence of fracture or other focal bone lesions. Soft tissues are unremarkable. IMPRESSION: No acute osseous injury of the left scapula. 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  Differential also includes neuropathy from cervical region but pain-free C-spine exam.  Shoulder exam including RTC testing  reassuring  -Symptomatic care with ice or heat as needed, relative range of motion, and if LFTs look okay can initially start with Tylenol over-the-counter.  With his gastrointestinal history, avoid NSAIDs.  -Recheck if not improving with above treatment, or possible Ortho eval.  Abdominal bloating - Plan: CBC, Comprehensive metabolic panel, Ambulatory referral to Gastroenterology Gastroesophageal reflux disease, esophagitis presence not specified - Plan: CBC, Comprehensive metabolic panel, Ambulatory referral to Gastroenterology, omeprazole (PRILOSEC) 40 MG capsule  -Check CBC, CMP, refer to GI as history of NSAID associated gastropathy, restart omeprazole 40 mg daily, and decrease alcohol intake.  If not improving, or any worsening symptoms would consider imaging.  RTC/ER precautions if worsening  Screening for diabetes mellitus - Plan: Comprehensive metabolic panel  Alcohol use - Plan: Comprehensive metabolic panel  -Recommended decreased use, especially with history of gastropathy/GERD.  Additionally Arellano pressure elevation may be related to alcohol intake.  Expect that to improve with decreased use. Recheck next ov.   Need for hepatitis C screening test - Plan: Hepatitis C antibody  Needs flu shot - Plan: Flu vaccine HIGH DOSE PF (Fluzone High dose)  Need for prophylactic vaccination against Streptococcus pneumoniae (pneumococcus) - Plan: Pneumococcal polysaccharide vaccine 23-valent greater than or equal to 2yo subcutaneous/IM  Language barrier  -Interpreter used via video.  Understanding expressed  Plan for wellness exam in the next 1 month.   Meds ordered this encounter  Medications  . omeprazole (PRILOSEC) 40 MG capsule    Sig: Take 1 capsule (40 mg total) by mouth daily.    Dispense:  30 capsule    Refill:  1   Patient Instructions   Flu shot and pneumonia shots given today.  Heat or ice to the sore area on your shoulder and gentle range of motion.  No medication for now  until I see your Arellano work.  If liver tests look okay then can take Tylenol but do not take Advil Aleve or other NSAIDs.   Restart omeprazole once per day.  Try to decrease alcohol to no more than 1-2 drinks per day.  I will refer you to gastroenterologist.    Return to the clinic or go to the nearest emergency room if  any of your symptoms worsen or new symptoms occur.  Please follow-up within the next 1 month for wellness exam.    If you have lab work done today you will be contacted with your lab results within the next 2 weeks.  If you have not heard from [place] then please contact us. The fastest way to get your results is to register for My Chart.   IF you received an x-ray today, you will receive an invoice from [facility]. Please contact [facility] at [phone] with questions or concerns regarding your invoice.   IF you received labwork today, you will receive an invoice from [facility]. Please contact [facility] at [phone] with questions or concerns regarding your invoice.   Our billing staff will not be able to assist you with questions regarding bills from these companies.  You will be contacted with the lab results as soon as they are available. The fastest way to get your results is to activate your My Chart account. Instructions are located on the last page of this paperwork. If you have not heard from [place] regarding the results in 2 weeks, please contact this office.       I personally performed the services described in this documentation, which was scribed in my presence. The recorded information has been reviewed and considered for accuracy and completeness, addended by me as needed, and agree with information above.  Signed,   Merri Ray, MD [facility].  01/17/18 12:37 PM

## 2018-01-15 LAB — COMPREHENSIVE METABOLIC PANEL
ALT: 27 IU/L (ref 0–44)
AST: 35 IU/L (ref 0–40)
Albumin/Globulin Ratio: 1.3 (ref 1.2–2.2)
Albumin: 4.2 g/dL (ref 3.5–4.8)
Alkaline Phosphatase: 76 IU/L (ref 39–117)
BILIRUBIN TOTAL: 0.9 mg/dL (ref 0.0–1.2)
BUN/Creatinine Ratio: 11 (ref 10–24)
BUN: 11 mg/dL (ref 8–27)
CALCIUM: 9.3 mg/dL (ref 8.6–10.2)
CHLORIDE: 104 mmol/L (ref 96–106)
CO2: 22 mmol/L (ref 20–29)
CREATININE: 1 mg/dL (ref 0.76–1.27)
GFR, EST AFRICAN AMERICAN: 87 mL/min/{1.73_m2} (ref >59)
GFR, EST NON AFRICAN AMERICAN: 75 mL/min/{1.73_m2} (ref >59)
GLUCOSE: 109 mg/dL — AB (ref 65–99)
Globulin, Total: 3.2 g/dL (ref 1.5–4.5)
Potassium: 5.3 mmol/L — ABNORMAL HIGH (ref 3.5–5.2)
Sodium: 144 mmol/L (ref 134–144)
TOTAL PROTEIN: 7.4 g/dL (ref 6.0–8.5)

## 2018-01-15 LAB — CBC
HEMOGLOBIN: 16.4 g/dL (ref 13.0–17.7)
Hematocrit: 48.2 % (ref 37.5–51.0)
MCH: 34.2 pg — ABNORMAL HIGH (ref 26.6–33.0)
MCHC: 34 g/dL (ref 31.5–35.7)
MCV: 100 fL — ABNORMAL HIGH (ref 79–97)
PLATELETS: 262 10*3/uL (ref 150–450)
RBC: 4.8 x10E6/uL (ref 4.14–5.80)
RDW: 13.2 % (ref 12.3–15.4)
WBC: 5.3 10*3/uL (ref 3.4–10.8)

## 2018-01-15 LAB — HEPATITIS C ANTIBODY: Hep C Virus Ab: <0.1 s/co ratio (ref 0.0–0.9)

## 2018-01-27 ENCOUNTER — Encounter: Payer: Self-pay | Admitting: *Deleted

## 2018-02-11 ENCOUNTER — Ambulatory Visit (INDEPENDENT_AMBULATORY_CARE_PROVIDER_SITE_OTHER): Payer: Medicare HMO | Admitting: Family Medicine

## 2018-02-11 ENCOUNTER — Encounter: Payer: Self-pay | Admitting: Family Medicine

## 2018-02-11 VITALS — BP 140/82 | HR 75 | Temp 99.0°F | Resp 16 | Ht 64.0 in | Wt 147.8 lb

## 2018-02-11 DIAGNOSIS — K219 Gastro-esophageal reflux disease without esophagitis: Secondary | ICD-10-CM | POA: Diagnosis not present

## 2018-02-11 DIAGNOSIS — Z0000 Encounter for general adult medical examination without abnormal findings: Secondary | ICD-10-CM | POA: Diagnosis not present

## 2018-02-11 DIAGNOSIS — E875 Hyperkalemia: Secondary | ICD-10-CM | POA: Diagnosis not present

## 2018-02-11 DIAGNOSIS — R739 Hyperglycemia, unspecified: Secondary | ICD-10-CM | POA: Diagnosis not present

## 2018-02-11 DIAGNOSIS — M25512 Pain in left shoulder: Secondary | ICD-10-CM

## 2018-02-11 NOTE — Patient Instructions (Addendum)
I will check repeat blood tests today.  Follow up with eye doctor as planned.  Call and schedule appointment if any change in vision.   If pain in shoulder not continuing to improve in next 4-6 weeks (or any worsening sooner) return for recheck.   Follow up with Dr. Penelope Coop (gastroenterologist) for stomach issue and colon cancer screening. [place].  We sent info about 4 weeks ago. Continue omeprazole daily for now.   Return to the clinic or go to the nearest emergency room if any of your symptoms worsen or new symptoms occur.  Thank you for coming in today.   Preventive Care 72 Years and Older, Male Preventive care refers to lifestyle choices and visits with your health care provider that can promote health and wellness. What does preventive care include?  A yearly physical exam. This is also called an annual well check.  Dental exams once or twice a year.  Routine eye exams. Ask your health care provider how often you should have your eyes checked.  Personal lifestyle choices, including: ? Daily care of your teeth and gums. ? Regular physical activity. ? Eating a healthy diet. ? Avoiding tobacco and drug use. ? Limiting alcohol use. ? Practicing safe sex. ? Taking low doses of aspirin every day. ? Taking vitamin and mineral supplements as recommended by your health care provider. What happens during an annual well check? The services and screenings done by your health care provider during your annual well check will depend on your age, overall health, lifestyle risk factors, and family history of disease. Counseling Your health care provider may ask you questions about your:  Alcohol use.  Tobacco use.  Drug use.  Emotional well-being.  Home and relationship well-being.  Sexual activity.  Eating habits.  History of falls.  Memory and ability to understand (cognition).  Work and work Statistician.  Screening You may have the following tests or  measurements:  Height, weight, and BMI.  Blood pressure.  Lipid and cholesterol levels. These may be checked every 5 years, or more frequently if you are over 42 years old.  Skin check.  Lung cancer screening. You may have this screening every year starting at age 32 if you have a 30-pack-year history of smoking and currently smoke or have quit within the past 15 years.  Fecal occult blood test (FOBT) of the stool. You may have this test every year starting at age 27.  Flexible sigmoidoscopy or colonoscopy. You may have a sigmoidoscopy every 5 years or a colonoscopy every 10 years starting at age 2.  Prostate cancer screening. Recommendations will vary depending on your family history and other risks.  Hepatitis C blood test.  Hepatitis B blood test.  Sexually transmitted disease (STD) testing.  Diabetes screening. This is done by checking your blood sugar (glucose) after you have not eaten for a while (fasting). You may have this done every 1-3 years.  Abdominal aortic aneurysm (AAA) screening. You may need this if you are a current or former smoker.  Osteoporosis. You may be screened starting at age 52 if you are at high risk.  Talk with your health care provider about your test results, treatment options, and if necessary, the need for more tests. Vaccines Your health care provider may recommend certain vaccines, such as:  Influenza vaccine. This is recommended every year.  Tetanus, diphtheria, and acellular pertussis (Tdap, Td) vaccine. You may need a Td booster every 10 years.  Varicella vaccine. You may need  this if you have not been vaccinated.  Zoster vaccine. You may need this after age 28.  Measles, mumps, and rubella (MMR) vaccine. You may need at least one dose of MMR if you were born in 1957 or later. You may also need a second dose.  Pneumococcal 13-valent conjugate (PCV13) vaccine. One dose is recommended after age 49.  Pneumococcal polysaccharide  (PPSV23) vaccine. One dose is recommended after age 38.  Meningococcal vaccine. You may need this if you have certain conditions.  Hepatitis A vaccine. You may need this if you have certain conditions or if you travel or work in places where you may be exposed to hepatitis A.  Hepatitis B vaccine. You may need this if you have certain conditions or if you travel or work in places where you may be exposed to hepatitis B.  Haemophilus influenzae type b (Hib) vaccine. You may need this if you have certain risk factors.  Talk to your health care provider about which screenings and vaccines you need and how often you need them. This information is not intended to replace advice given to you by your health care provider. Make sure you discuss any questions you have with your health care provider. Document Released: 05/03/2015 Document Revised: 12/25/2015 Document Reviewed: 02/05/2015 Elsevier Interactive Patient Education  [employer].   If you have lab work done today you will be contacted with your lab results within the next 2 weeks.  If you have not heard from [place] then please contact us. The fastest way to get your results is to register for My Chart.   IF you received an x-ray today, you will receive an invoice from [facility]. Please contact [facility] at [phone] with questions or concerns regarding your invoice.   IF you received labwork today, you will receive an invoice from [facility]. Please contact [facility] at [phone] with questions or concerns regarding your invoice.   Our billing staff will not be able to assist you with questions regarding bills from these companies.  You will be contacted with the lab results as soon as they are available. The fastest way to get your results is to activate your My Chart account. Instructions are located on the last page of this paperwork. If you have not heard from [place] regarding the results in 2 weeks, please  contact this office.

## 2018-02-11 NOTE — Progress Notes (Signed)
Subjective:  By signing my name below, I, [name], attest that this documentation has been prepared under the direction and in the presence of Merri Ray, MD. Electronically Signed: [name], [place]. 02/11/2018 , 4:19 PM .  Patient was seen in Room 1 .   Patient ID: Harry Arellano, male    DOB: 30-Apr-1945, 72 y.o.   MRN: [id number] Chief Complaint  Patient presents with  . Medicare Wellness   HPI Harry Arellano is a 72 y.o. male Here for annual wellness exam. Last saw patient on Sept 27th for suspected gastritis. He was referred to GI, restarted on PPI and advised to reduce alcohol intake. He was also having left shoulder pain at that time; thought to be muscular at the upper trapezius. Scapula xray without concerns. Symptomatic care with tylenol and RTC precautions.   Primary language is [place]; [employer] video interpreter called for translation assistance, [id number]. His daughter is able to translate to [place] from [place] at home.   Hyperglycemia His glucose was 109 at last visit. Potassium borderline elevated at 5.3; plan repeat today.   Cancer Screening Colonoscopy: he states he hasn't had a colonoscopy done in the past 3-4 years; was informed "wait until we call you." He doesn't have an appointment scheduled with GI yet; his GI is Dr. Penelope Coop at [place].   He denies having any abdominal pain, just some reflux when he eats and swallows rice. He also mentions having some mucus in his throat in the morning. He's been taking his omeprazole.   Immunizations Immunization History  Administered Date(s) Administered  . Influenza Split 01/25/2012  . Influenza, High Dose Seasonal PF 01/14/2018  . Pneumococcal Conjugate-13 11/29/2014  . Pneumococcal Polysaccharide-23 01/14/2018  . Td 11/29/2014    Falls Screening He denies any falls in the past year.   Depression Depression screen [facility] 2/9 01/14/2018 03/26/2016 11/29/2014  Decreased Interest 0 0 0  Down, Depressed, Hopeless  0 0 0  PHQ - 2 Score 0 0 0     Vision  Visual Acuity Screening   Right eye Left eye Both eyes  Without correction:     With correction: '20/40 20/70 20/40 '   He wears corrective glasses. He last saw his eye doctor in March 2019. He reports his vision has been about the same.   Dentist He has dentures.   Exercise He does exercises in the morning with walking.   Functional Status Survey Is the patient deaf or have difficulty hearing?: No(sometimes ears ring) Does the patient have difficulty seeing, even when wearing glasses/contacts?: No Does the patient have difficulty concentrating, remembering, or making decisions?: No Does the patient have difficulty walking or climbing stairs?: No Does the patient have difficulty dressing or bathing?: Yes Does the patient have difficulty doing errands alone such as visiting a doctor's office or shopping?: No   Difficulty dressing: he had left shoulder pain at last visit; today his pain has been improving. He denies taking medication for this.   Mental Status Survey 6CIT Screen 01/14/2018  What Year? 0 points  What month? 0 points  What time? 0 points  Count back from 20 0 points  Months in reverse 0 points  Repeat phrase 0 points  Total Score 0   Memory screening performed last visit.   Advanced Directives Information provided last visit.    Patient Active Problem List   Diagnosis Date Noted  . Substance dependence ([place]) 06/08/2013  . Language barrier 09/15/2012  . NSAID-associated gastropathy 01/25/2012  .  Nicotine addiction 01/25/2012  . GERD (gastroesophageal reflux disease) 01/25/2012   Past Medical History:  Diagnosis Date  . Smoker    No past surgical history on file. Allergies  Allergen Reactions  . Penicillins Other (See Comments)    Pass out    Prior to Admission medications   Medication Sig Start Date End Date Taking? Authorizing Provider  B Complex Vitamins (VITAMIN B COMPLEX) TABS Take 1 OTC tablet daily  for 6 months and then return to clinic. Please limit alcohol to no more than 2 drinks per day. Patient not taking: Reported on 01/14/2018 12/02/14   Tereasa Coop, PA-C  omeprazole (PRILOSEC) 40 MG capsule Take 1 capsule (40 mg total) by mouth daily. 01/14/18   Wendie Agreste, MD   Social History   Socioeconomic History  . Marital status: Married    Spouse name: Not on file  . Number of children: Not on file  . Years of education: Not on file  . Highest education level: Not on file  Occupational History  . Not on file  Social Needs  . Financial resource strain: Not on file  . Food insecurity:    Worry: Not on file    Inability: Not on file  . Transportation needs:    Medical: Not on file    Non-medical: Not on file  Tobacco Use  . Smoking status: Former Smoker    Packs/day: 0.50  . Smokeless tobacco: Never Used  Substance and Sexual Activity  . Alcohol use: Yes    Alcohol/week: 3.0 standard drinks    Types: 3 Cans of beer per week  . Drug use: No  . Sexual activity: Not on file  Lifestyle  . Physical activity:    Days per week: Not on file    Minutes per session: Not on file  . Stress: Not on file  Relationships  . Social connections:    Talks on phone: Not on file    Gets together: Not on file    Attends religious service: Not on file    Active member of club or organization: Not on file    Attends meetings of clubs or organizations: Not on file    Relationship status: Not on file  . Intimate partner violence:    Fear of current or ex partner: Not on file    Emotionally abused: Not on file    Physically abused: Not on file    Forced sexual activity: Not on file  Other Topics Concern  . Not on file  Social History Narrative  . Not on file   Review of Systems 13 point ROS - negative     Objective:   Physical Exam  Constitutional: He is oriented to person, place, and time. He appears well-developed and well-nourished.  HENT:  Head: Normocephalic and  atraumatic.  Right Ear: External ear normal.  Left Ear: External ear normal.  Mouth/Throat: Oropharynx is clear and moist.  Eyes: Pupils are equal, round, and reactive to light. Conjunctivae and EOM are normal.  Neck: Normal range of motion. Neck supple. No thyromegaly present.  Cardiovascular: Normal rate, regular rhythm, normal heart sounds and intact distal pulses.  Pulmonary/Chest: Effort normal and breath sounds normal. No respiratory distress. He has no wheezes.  Abdominal: Soft. He exhibits no distension. There is no tenderness.  Musculoskeletal: Normal range of motion. He exhibits no edema or tenderness.  Left shoulder: describes pain at posterior left shoulder, full ROM, full rotator cuff strength, negative neer, negative  hawkin  Lymphadenopathy:    He has no cervical adenopathy.  Neurological: He is alert and oriented to person, place, and time. He has normal reflexes.  Skin: Skin is warm and dry.  Psychiatric: He has a normal mood and affect. His behavior is normal.  Vitals reviewed.   Vitals:   02/11/18 1555  BP: 140/82  Pulse: 75  Resp: 16  Temp: 99 F (37.2 C)  TempSrc: Oral  SpO2: 98%  Weight: 147 lb 12.8 oz (67 kg)  Height: '5\' 4"'  (1.626 m)       Assessment & Plan:   Harry Arellano is a 72 y.o. male Medicare annual wellness visit, subsequent  - - anticipatory guidance as below in AVS, screening labs if needed. Health maintenance items as above in HPI discussed/recommended as applicable.  - no concerning responses on depression, fall, or functional status screening. Any positive responses noted as above. Advanced directives discussed as in CHL.   Hyperkalemia - Plan: Basic metabolic panel,   -Asymptomatic, repeat labs.  Hyperglycemia - Plan: Hemoglobin A1c  -Mild elevation previously, check A1c for diabetes screening  Acute pain of left shoulder  -Pain now improving.  May have been component of rotator cuff tendinosis, RTC precautions if not continuing to  improve.  Good range of motion and strength at present.  Gastroesophageal reflux disease, esophagitis presence not specified  -Improved.  Phone number provided to follow-up with gastroenterologist.  No orders of the defined types were placed in this encounter.  Patient Instructions   I will check repeat blood tests today.  Follow up with eye doctor as planned.  Call and schedule appointment if any change in vision.   If pain in shoulder not continuing to improve in next 4-6 weeks (or any worsening sooner) return for recheck.   Follow up with Dr. Penelope Coop (gastroenterologist) for stomach issue and colon cancer screening. [place].  We sent info about 4 weeks ago. Continue omeprazole daily for now.   Return to the clinic or go to the nearest emergency room if any of your symptoms worsen or new symptoms occur.  Thank you for coming in today.   Preventive Care 69 Years and Older, Male Preventive care refers to lifestyle choices and visits with your health care provider that can promote health and wellness. What does preventive care include?  A yearly physical exam. This is also called an annual well check.  Dental exams once or twice a year.  Routine eye exams. Ask your health care provider how often you should have your eyes checked.  Personal lifestyle choices, including: ? Daily care of your teeth and gums. ? Regular physical activity. ? Eating a healthy diet. ? Avoiding tobacco and drug use. ? Limiting alcohol use. ? Practicing safe sex. ? Taking low doses of aspirin every day. ? Taking vitamin and mineral supplements as recommended by your health care provider. What happens during an annual well check? The services and screenings done by your health care provider during your annual well check will depend on your age, overall health, lifestyle risk factors, and family history of disease. Counseling Your health care provider may ask you questions about your:  Alcohol  use.  Tobacco use.  Drug use.  Emotional well-being.  Home and relationship well-being.  Sexual activity.  Eating habits.  History of falls.  Memory and ability to understand (cognition).  Work and work Statistician.  Screening You may have the following tests or measurements:  Height, weight, and BMI.  Blood  pressure.  Lipid and cholesterol levels. These may be checked every 5 years, or more frequently if you are over 66 years old.  Skin check.  Lung cancer screening. You may have this screening every year starting at age 26 if you have a 30-pack-year history of smoking and currently smoke or have quit within the past 15 years.  Fecal occult blood test (FOBT) of the stool. You may have this test every year starting at age 97.  Flexible sigmoidoscopy or colonoscopy. You may have a sigmoidoscopy every 5 years or a colonoscopy every 10 years starting at age 40.  Prostate cancer screening. Recommendations will vary depending on your family history and other risks.  Hepatitis C blood test.  Hepatitis B blood test.  Sexually transmitted disease (STD) testing.  Diabetes screening. This is done by checking your blood sugar (glucose) after you have not eaten for a while (fasting). You may have this done every 1-3 years.  Abdominal aortic aneurysm (AAA) screening. You may need this if you are a current or former smoker.  Osteoporosis. You may be screened starting at age 33 if you are at high risk.  Talk with your health care provider about your test results, treatment options, and if necessary, the need for more tests. Vaccines Your health care provider may recommend certain vaccines, such as:  Influenza vaccine. This is recommended every year.  Tetanus, diphtheria, and acellular pertussis (Tdap, Td) vaccine. You may need a Td booster every 10 years.  Varicella vaccine. You may need this if you have not been vaccinated.  Zoster vaccine. You may need this after age  3.  Measles, mumps, and rubella (MMR) vaccine. You may need at least one dose of MMR if you were born in 1957 or later. You may also need a second dose.  Pneumococcal 13-valent conjugate (PCV13) vaccine. One dose is recommended after age 35.  Pneumococcal polysaccharide (PPSV23) vaccine. One dose is recommended after age 44.  Meningococcal vaccine. You may need this if you have certain conditions.  Hepatitis A vaccine. You may need this if you have certain conditions or if you travel or work in places where you may be exposed to hepatitis A.  Hepatitis B vaccine. You may need this if you have certain conditions or if you travel or work in places where you may be exposed to hepatitis B.  Haemophilus influenzae type b (Hib) vaccine. You may need this if you have certain risk factors.  Talk to your health care provider about which screenings and vaccines you need and how often you need them. This information is not intended to replace advice given to you by your health care provider. Make sure you discuss any questions you have with your health care provider. Document Released: 05/03/2015 Document Revised: 12/25/2015 Document Reviewed: 02/05/2015 Elsevier Interactive Patient Education  [employer].   If you have lab work done today you will be contacted with your lab results within the next 2 weeks.  If you have not heard from [place] then please contact us. The fastest way to get your results is to register for My Chart.   IF you received an x-ray today, you will receive an invoice from [facility]. Please contact [facility] at [phone] with questions or concerns regarding your invoice.   IF you received labwork today, you will receive an invoice from [facility]. Please contact [facility] at [phone] with questions or concerns regarding your invoice.   Our billing staff will not be able to  assist you with questions regarding bills from these companies.  You will  be contacted with the lab results as soon as they are available. The fastest way to get your results is to activate your My Chart account. Instructions are located on the last page of this paperwork. If you have not heard from [place] regarding the results in 2 weeks, please contact this office.       I personally performed the services described in this documentation, which was scribed in my presence. The recorded information has been reviewed and considered for accuracy and completeness, addended by me as needed, and agree with information above.  Signed,   Merri Ray, MD [facility] [place].  02/13/18 12:20 PM

## 2018-02-12 LAB — BASIC METABOLIC PANEL
BUN/Creatinine Ratio: 19 (ref 10–24)
BUN: 16 mg/dL (ref 8–27)
CO2: 22 mmol/L (ref 20–29)
CREATININE: 0.84 mg/dL (ref 0.76–1.27)
Calcium: 9.5 mg/dL (ref 8.6–10.2)
Chloride: 102 mmol/L (ref 96–106)
GFR calc Af Amer: 101 mL/min/{1.73_m2} (ref >59)
GFR, EST NON AFRICAN AMERICAN: 87 mL/min/{1.73_m2} (ref >59)
Glucose: 106 mg/dL — ABNORMAL HIGH (ref 65–99)
Potassium: 4.4 mmol/L (ref 3.5–5.2)
SODIUM: 140 mmol/L (ref 134–144)

## 2018-02-12 LAB — HEMOGLOBIN A1C
ESTIMATED AVERAGE GLUCOSE: 111 mg/dL
Hgb A1c MFr Bld: 5.5 % (ref 4.8–5.6)

## 2018-02-28 ENCOUNTER — Encounter: Payer: Self-pay | Admitting: Family Medicine

## 2018-03-08 ENCOUNTER — Other Ambulatory Visit: Payer: Self-pay | Admitting: Gastroenterology

## 2018-03-08 DIAGNOSIS — K219 Gastro-esophageal reflux disease without esophagitis: Secondary | ICD-10-CM | POA: Diagnosis not present

## 2018-03-09 ENCOUNTER — Ambulatory Visit
Admission: RE | Admit: 2018-03-09 | Discharge: 2018-03-09 | Disposition: A | Discharge status: Home / Self Care | Payer: Medicare HMO | Source: Ambulatory Visit | Attending: Gastroenterology | Admitting: Gastroenterology

## 2018-03-09 DIAGNOSIS — K219 Gastro-esophageal reflux disease without esophagitis: Secondary | ICD-10-CM

## 2018-03-11 ENCOUNTER — Other Ambulatory Visit: Payer: Self-pay | Admitting: Family Medicine

## 2018-03-11 DIAGNOSIS — K219 Gastro-esophageal reflux disease without esophagitis: Secondary | ICD-10-CM

## 2018-03-11 MED ORDER — OMEPRAZOLE 40 MG PO CPDR: 40.0000 mg | DELAYED_RELEASE_CAPSULE | Freq: Every day | ORAL | 3 refills | Status: AC

## 2018-03-11 NOTE — Telephone Encounter (Signed)
Copied from [place] [phone]. Topic: Quick Communication - Rx Refill/Question >> Mar 11, 2018  9:19 AM Virl Axe D wrote: Medication: omeprazole (PRILOSEC) 40 MG capsule   Has the patient contacted their pharmacy? Yes.   (Agent: If no, request that the patient contact the pharmacy for the refill.) (Agent: If yes, when and what did the pharmacy advise?)  Preferred Pharmacy (with phone number or street name): [facility] [place], [place] [phone] (Phone) [phone] (Fax)    Agent: Please be advised that RX refills may take up to 3 business days. We ask that you follow-up with your pharmacy.

## 2018-04-22 DIAGNOSIS — K219 Gastro-esophageal reflux disease without esophagitis: Secondary | ICD-10-CM | POA: Diagnosis not present

## 2018-06-30 DIAGNOSIS — H35372 Puckering of macula, left eye: Secondary | ICD-10-CM | POA: Diagnosis not present

## 2018-06-30 DIAGNOSIS — H35352 Cystoid macular degeneration, left eye: Secondary | ICD-10-CM | POA: Diagnosis not present

## 2018-06-30 DIAGNOSIS — H59032 Cystoid macular edema following cataract surgery, left eye: Secondary | ICD-10-CM | POA: Diagnosis not present

## 2018-06-30 DIAGNOSIS — H04123 Dry eye syndrome of bilateral lacrimal glands: Secondary | ICD-10-CM | POA: Diagnosis not present

## 2019-03-09 IMAGING — DX DG SCAPULA*L*
2 series · 2 of 2 positions shown · non-contrast
Comparison: None.

CLINICAL DATA: left upper trapezius, left scapula pain for past 2
months.

EXAM:
LEFT SCAPULA - 2+ VIEWS

[scapula ap]
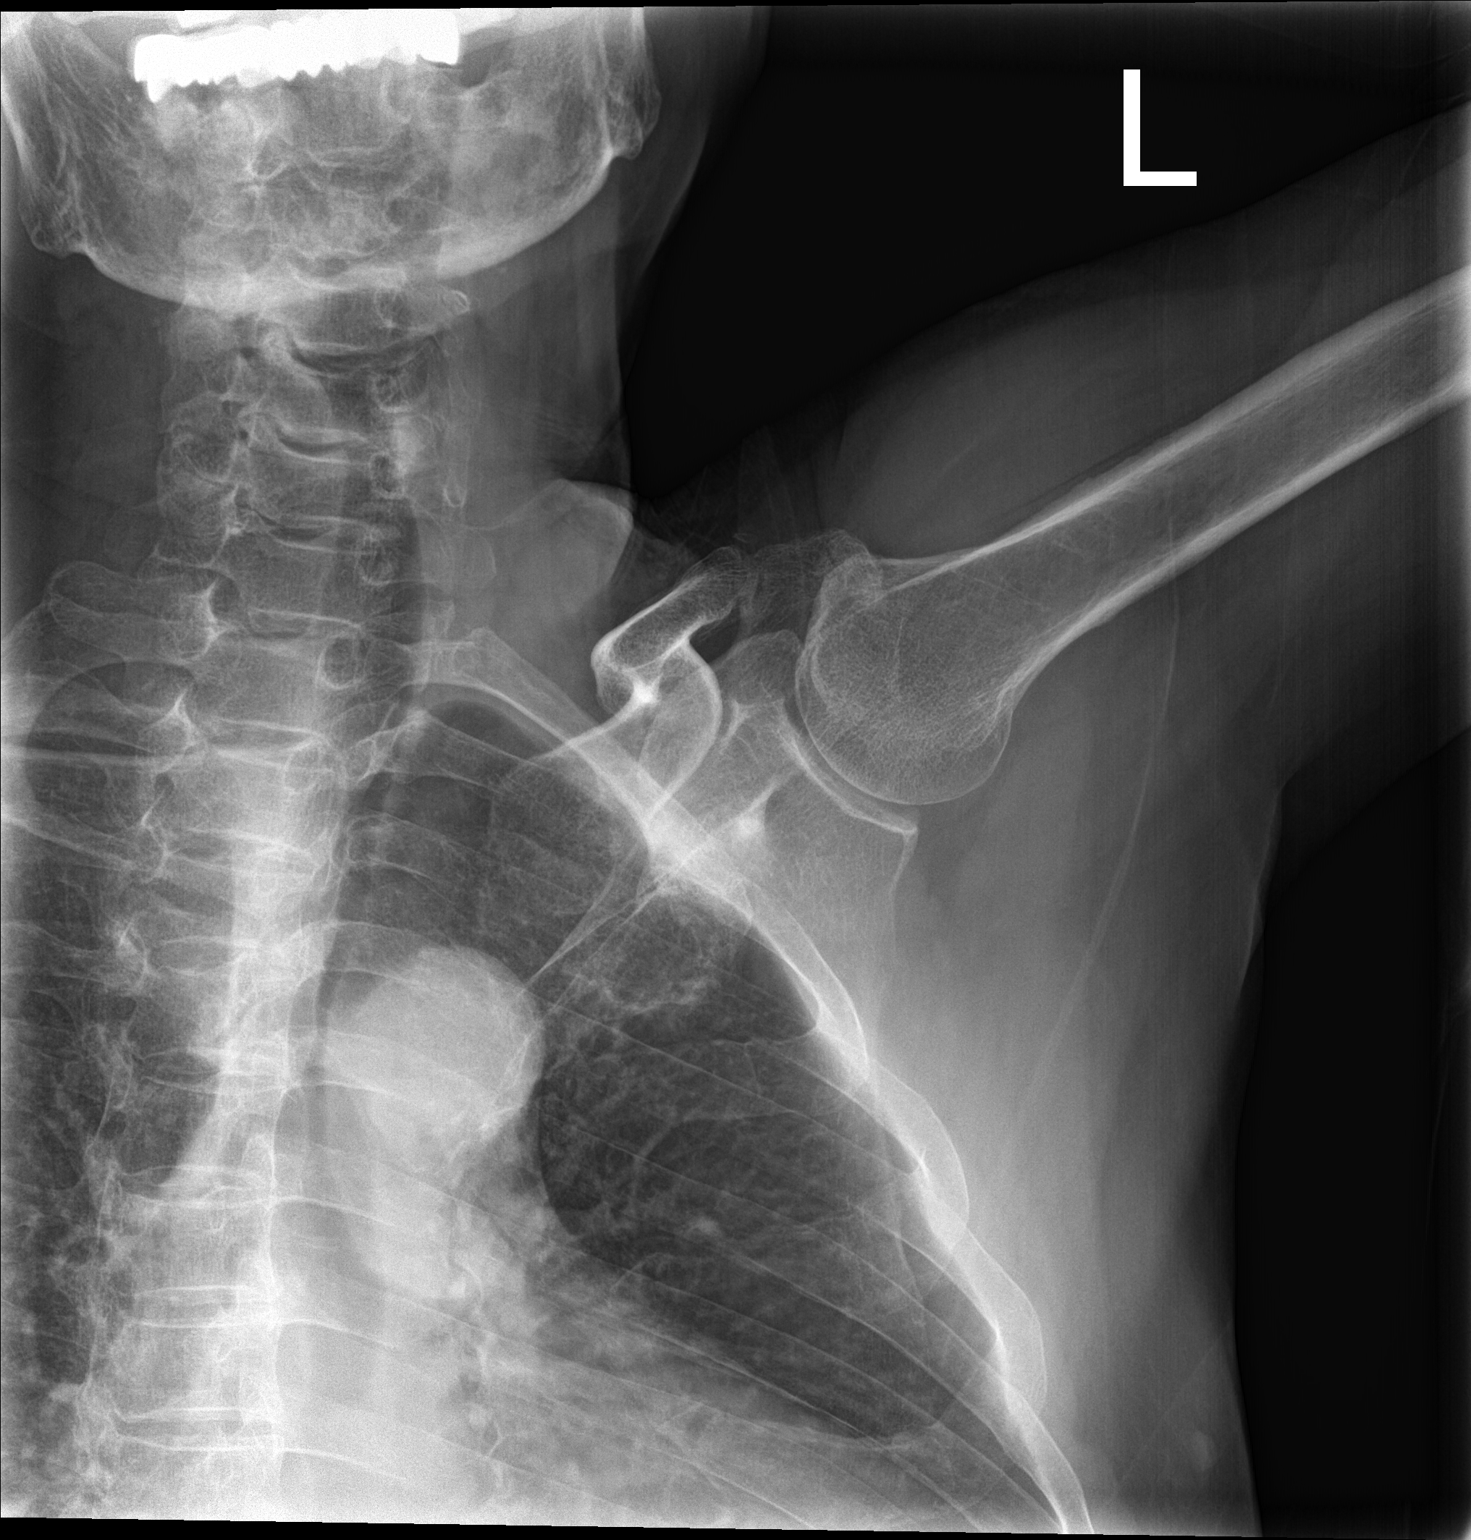

[scapula lat]
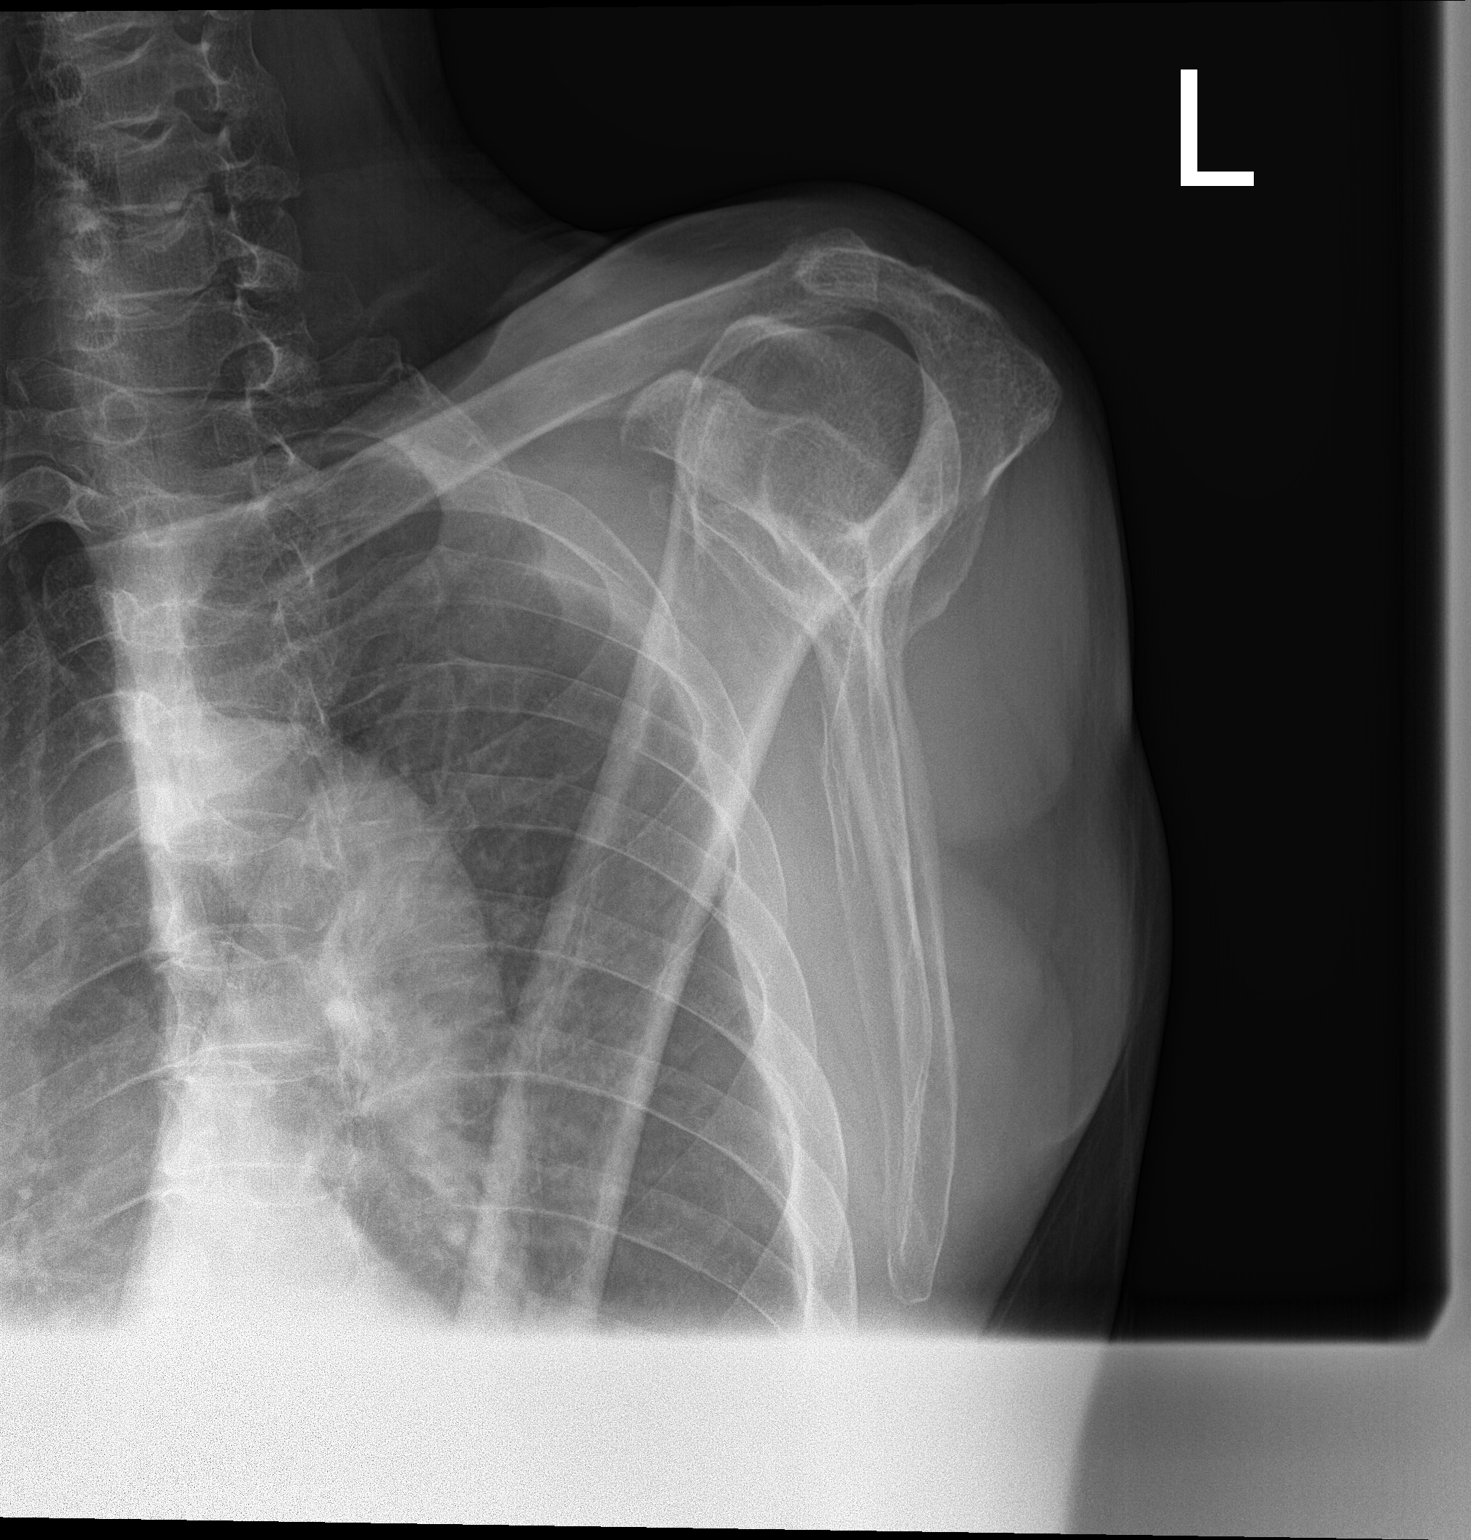

[2 of 2 positions shown; findings below may reference images not displayed]

FINDINGS: There is no evidence of fracture or other focal bone lesions. Soft
tissues are unremarkable.
IMPRESSION: No acute osseous injury of the left scapula.

## 2019-05-02 IMAGING — RF DG UGI W/ HIGH DENSITY W/KUB
10 of 12 series · 14 of 18 positions shown · non-contrast
Comparison: CT abdomen pelvis 02/26/2011 and upper GI 09/17/2010

CLINICAL DATA: Gastroesophageal reflux, esophagitis

EXAM:
UPPER GI SERIES WITH KUB
TECHNIQUE: After obtaining a scout radiograph a routine upper GI series was
performed using thin and high density barium.
FLUOROSCOPY TIME:  Fluoroscopy Time:  1 minutes 18 seconds
Radiation Exposure Index (if provided by the fluoroscopic device):
95 mGy
Number of Acquired Spot Images: 0

[Series 1: one shot · 0.14mm/px · 1 of 1 slices shown (1 of 2)]
[im 1/1]
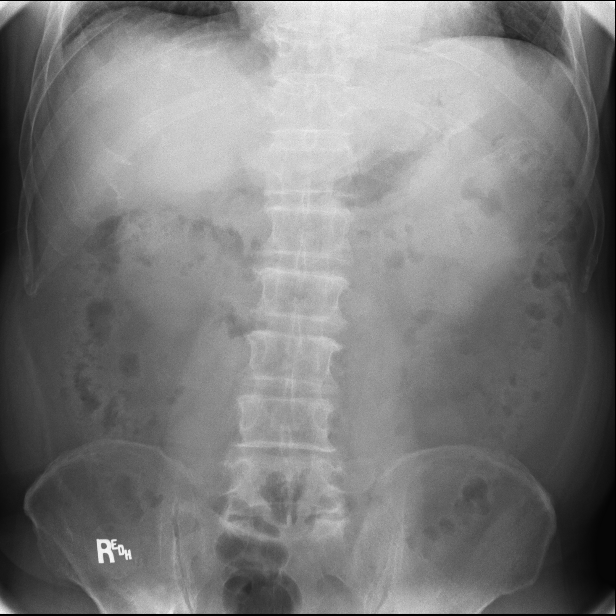

[Series 2: sequence · 0.29mm/px · 3 of 10 frames shown (1 of 8)]
[frame 2/10]
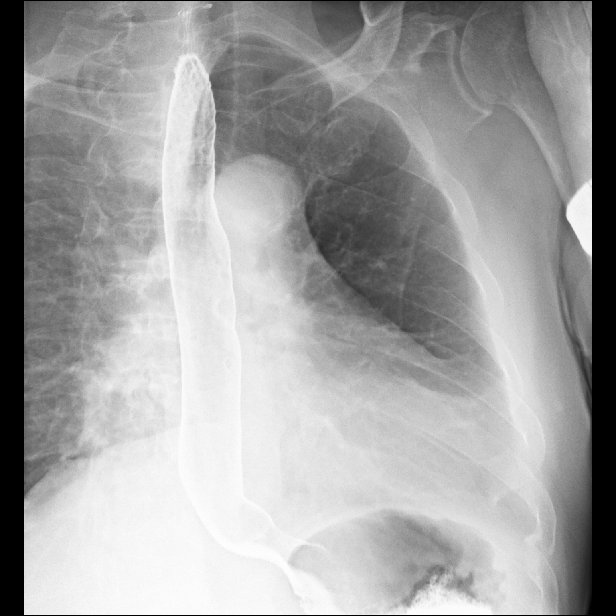
[frame 8/10]
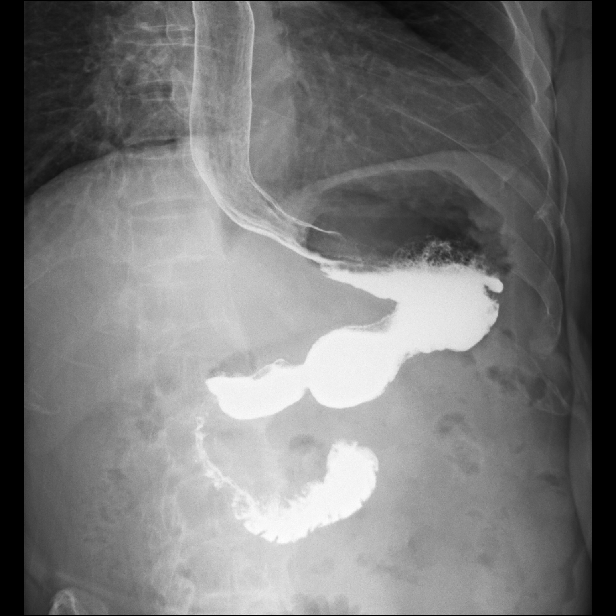
[frame 9/10]
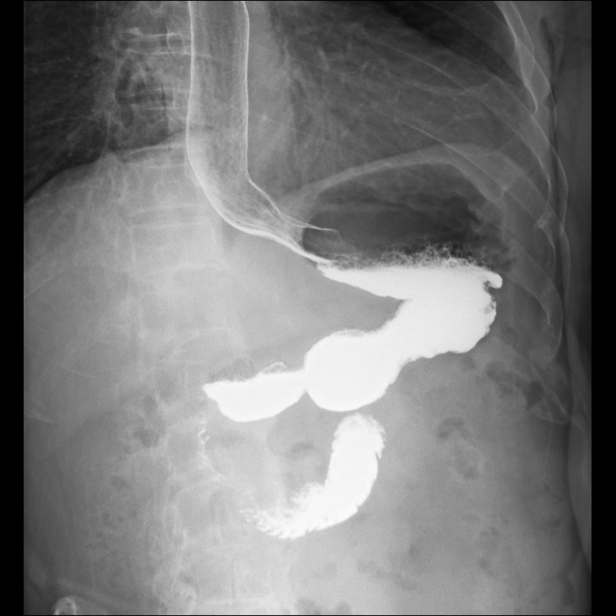

[Series 3: sequence · 0.29mm/px · 1 of 1 slices shown (2 of 8)]
[im 1/1]
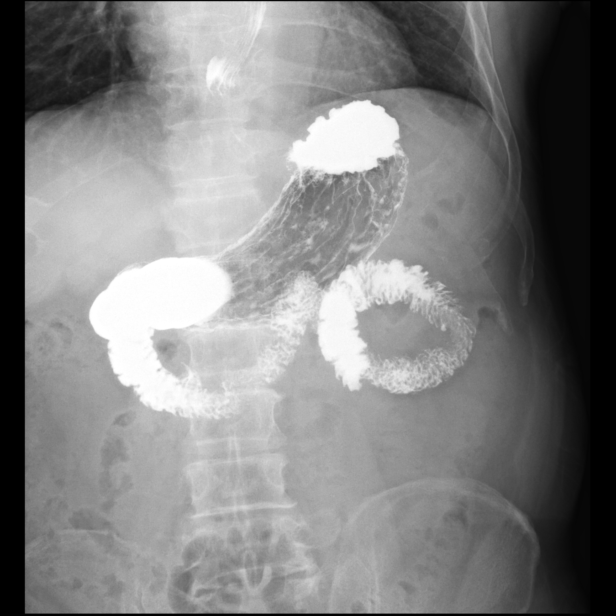

[Series 5: sequence · 0.29mm/px · 1 of 1 slices shown (3 of 8)]
[im 1/1]
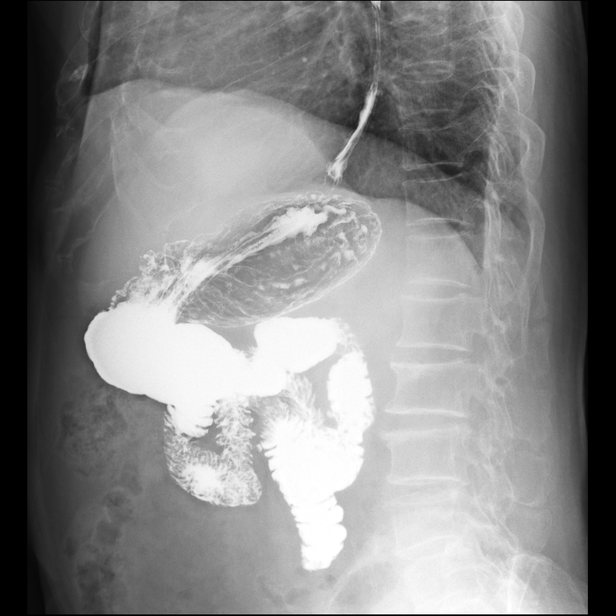

[Series 6: sequence · 0.29mm/px · 3 of 20 frames shown (4 of 8)]
[frame 4/20]
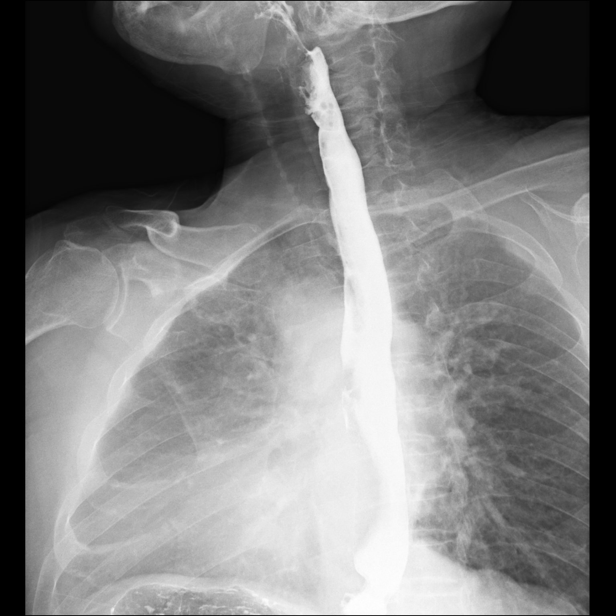
[frame 7/20]
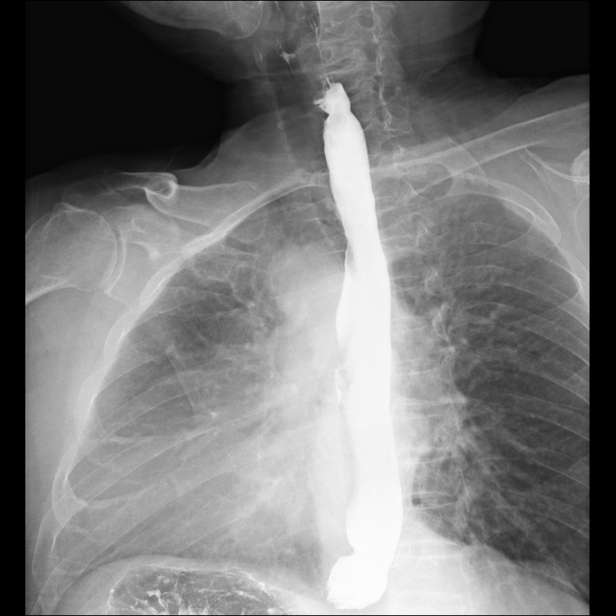
[frame 11/20]
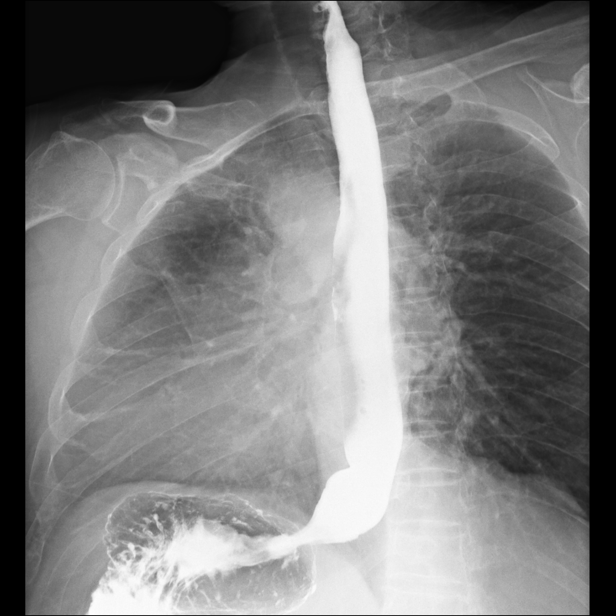

[Series 7: sequence · 0.29mm/px · 1 of 1 slices shown (5 of 8)]
[im 1/1]
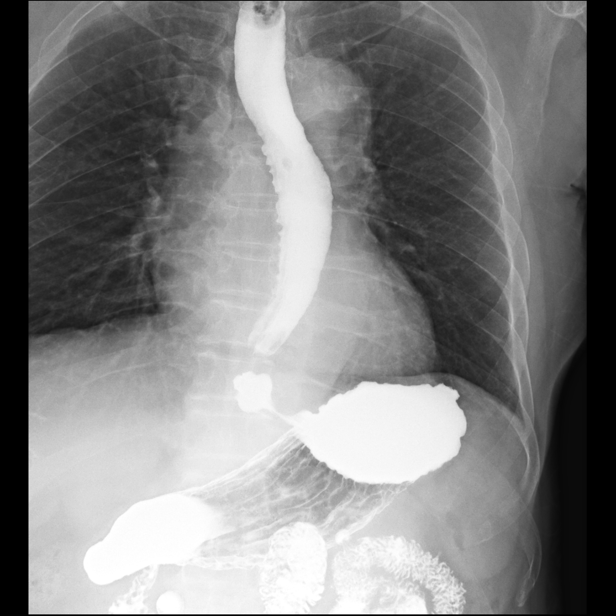

[Series 8: sequence · 0.29mm/px · 1 of 1 slices shown (6 of 8)]
[im 1/1]
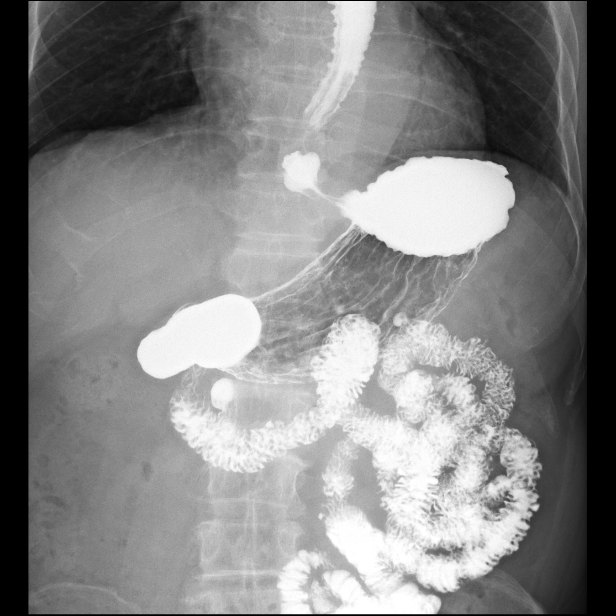

[Series 9: sequence · 0.29mm/px · 1 of 1 slices shown (7 of 8)]
[im 1/1]
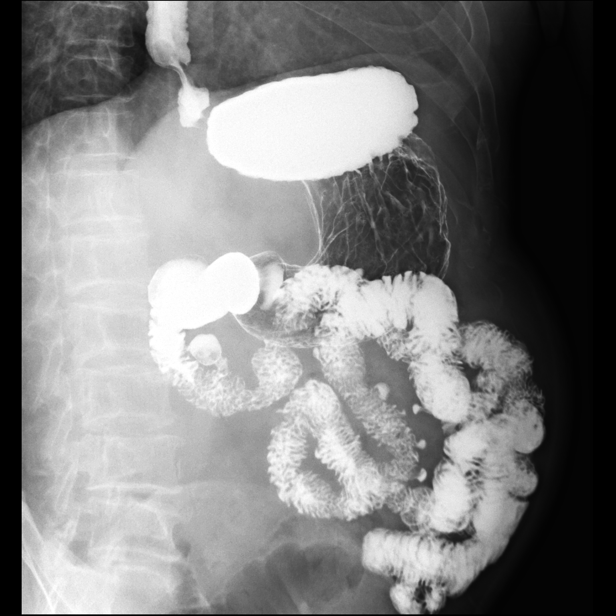

[Series 11: sequence · 0.29mm/px · 1 of 1 slices shown (8 of 8)]
[im 1/1]
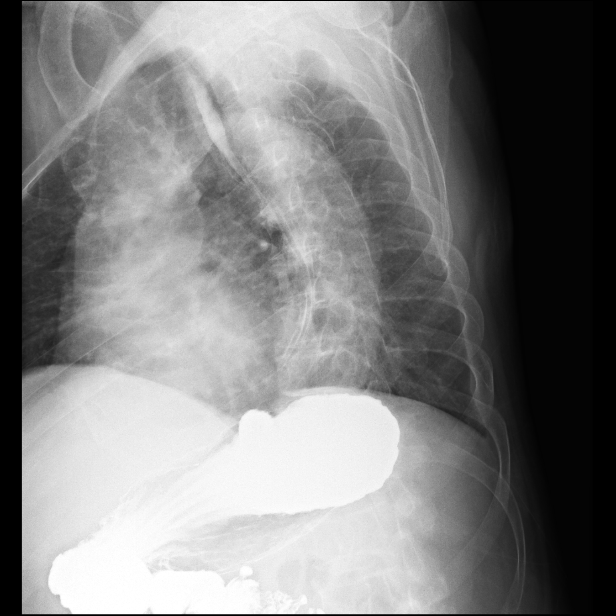

[Series 12: one shot · 1 of 1 slices shown (2 of 2)]
[im 1/1]
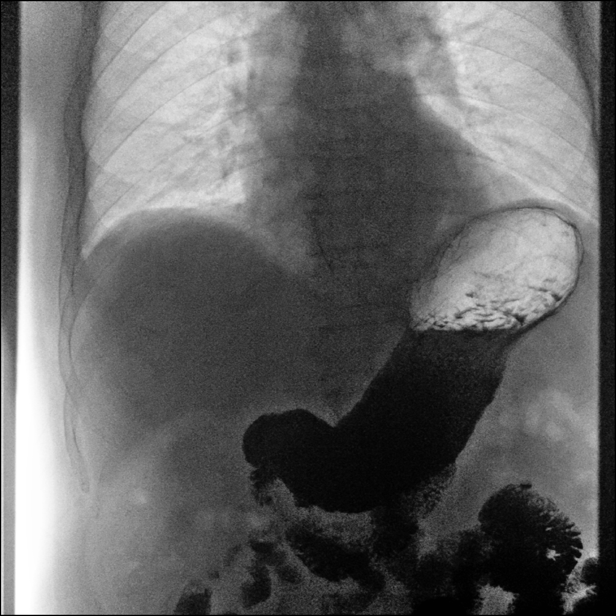

[14 of 18 positions shown; findings below may reference images not displayed]

FINDINGS: A preliminary film of the abdomen shows a nonspecific bowel gas
pattern. No opaque calculi are seen. There are mild degenerative
changes the L5-S1 level.

Initially a double-contrast study was performed. The mucosa of the
esophagus is unremarkable. The gastroesophageal junction is noted to
be somewhat patulous. There is slight irregularity of the gastric
rugae which is nonspecific but could represent gastritis in the
proper clinical setting. No mass or ulceration is seen. There are
tertiary contractions in the mid and distal esophagus. No hiatal
hernia is seen. There is moderate gastroesophageal reflux
demonstrated. The patient was given a barium pill at the end of the
study which passed into the stomach without delay. Again a
descending duodenal diverticulum is noted as well as multiple small
bowel diverticula.
IMPRESSION: 1. Moderate gastroesophageal reflux with somewhat patulous GE
junction. No hiatal hernia.
2. Slight irregularity of the gastric rugae which is nonspecific but
may indicate gastritis in the proper clinical setting. No mass or
ulceration.
3. Multiple small bowel diverticula.
4. Moderate tertiary contractions in the mid and distal esophagus.

## 2019-06-09 ENCOUNTER — Ambulatory Visit: Payer: Medicare HMO

## 2019-06-11 ENCOUNTER — Ambulatory Visit: Payer: Medicare HMO | Attending: Internal Medicine

## 2019-06-15 ENCOUNTER — Ambulatory Visit: Payer: Medicare HMO | Attending: Internal Medicine

## 2019-06-15 DIAGNOSIS — Z23 Encounter for immunization: Secondary | ICD-10-CM

## 2019-06-15 NOTE — Progress Notes (Signed)
   Covid-19 Vaccination Clinic  Name:  Harry Arellano    MRN: [id number] DOB: Apr 24, 1945  06/15/2019  [name] was observed post Covid-19 immunization for 15 minutes without incidence. He was provided with Vaccine Information Sheet and instruction to access the V-Safe system.   [name] was instructed to call 911 with any severe reactions post vaccine: Marland Kitchen Difficulty breathing  . Swelling of your face and throat  . A fast heartbeat  . A bad rash all over your body  . Dizziness and weakness    Immunizations Administered    Name Date Dose VIS Date Route   Pfizer COVID-19 Vaccine 06/15/2019  8:23 AM 0.3 mL 03/31/2019 Intramuscular   Manufacturer: [place]   Lot: [id number]   [place]: [id number]

## 2019-07-04 DIAGNOSIS — H04123 Dry eye syndrome of bilateral lacrimal glands: Secondary | ICD-10-CM | POA: Diagnosis not present

## 2019-07-04 DIAGNOSIS — H35352 Cystoid macular degeneration, left eye: Secondary | ICD-10-CM | POA: Diagnosis not present

## 2019-07-04 DIAGNOSIS — H59032 Cystoid macular edema following cataract surgery, left eye: Secondary | ICD-10-CM | POA: Diagnosis not present

## 2019-07-04 DIAGNOSIS — H35372 Puckering of macula, left eye: Secondary | ICD-10-CM | POA: Diagnosis not present

## 2019-07-05 ENCOUNTER — Ambulatory Visit: Payer: Medicare HMO | Attending: Internal Medicine

## 2019-07-05 DIAGNOSIS — Z23 Encounter for immunization: Secondary | ICD-10-CM

## 2019-07-05 NOTE — Progress Notes (Signed)
   Covid-19 Vaccination Clinic  Name:  Harry Arellano    MRN: [id number] DOB: 28-Oct-1945  07/05/2019  Mr. Harry Arellano was observed post Covid-19 immunization for 15 minutes without incident. He was provided with Vaccine Information Sheet and instruction to access the V-Safe system.   Mr. Harry Arellano was instructed to call 911 with any severe reactions post vaccine: Marland Kitchen Difficulty breathing  . Swelling of face and throat  . A fast heartbeat  . A bad rash all over body  . Dizziness and weakness   Immunizations Administered    Name Date Dose VIS Date Route   Pfizer COVID-19 Vaccine 07/05/2019 10:22 AM 0.3 mL 03/31/2019 Intramuscular   Manufacturer: [place]   Lot: [id number]   [employer]: [id number]

## 2019-07-18 DIAGNOSIS — H0102B Squamous blepharitis left eye, upper and lower eyelids: Secondary | ICD-10-CM | POA: Diagnosis not present

## 2019-07-18 DIAGNOSIS — H02002 Unspecified entropion of right lower eyelid: Secondary | ICD-10-CM | POA: Diagnosis not present

## 2019-07-18 DIAGNOSIS — H02005 Unspecified entropion of left lower eyelid: Secondary | ICD-10-CM | POA: Diagnosis not present

## 2019-07-18 DIAGNOSIS — H0102A Squamous blepharitis right eye, upper and lower eyelids: Secondary | ICD-10-CM | POA: Diagnosis not present

## 2019-07-25 DIAGNOSIS — H10021 Other mucopurulent conjunctivitis, right eye: Secondary | ICD-10-CM | POA: Diagnosis not present

## 2019-07-25 DIAGNOSIS — H02005 Unspecified entropion of left lower eyelid: Secondary | ICD-10-CM | POA: Diagnosis not present

## 2019-07-25 DIAGNOSIS — H02002 Unspecified entropion of right lower eyelid: Secondary | ICD-10-CM | POA: Diagnosis not present

## 2019-07-28 DIAGNOSIS — H02834 Dermatochalasis of left upper eyelid: Secondary | ICD-10-CM | POA: Diagnosis not present

## 2019-07-28 DIAGNOSIS — H02035 Senile entropion of left lower eyelid: Secondary | ICD-10-CM | POA: Diagnosis not present

## 2019-07-28 DIAGNOSIS — H02012 Cicatricial entropion of right lower eyelid: Secondary | ICD-10-CM | POA: Diagnosis not present

## 2019-07-28 DIAGNOSIS — H02032 Senile entropion of right lower eyelid: Secondary | ICD-10-CM | POA: Diagnosis not present

## 2019-07-28 DIAGNOSIS — H02831 Dermatochalasis of right upper eyelid: Secondary | ICD-10-CM | POA: Diagnosis not present

## 2019-07-28 DIAGNOSIS — H10021 Other mucopurulent conjunctivitis, right eye: Secondary | ICD-10-CM | POA: Diagnosis not present

## 2019-07-28 DIAGNOSIS — H02015 Cicatricial entropion of left lower eyelid: Secondary | ICD-10-CM | POA: Diagnosis not present

## 2020-01-11 DIAGNOSIS — H02015 Cicatricial entropion of left lower eyelid: Secondary | ICD-10-CM | POA: Diagnosis not present

## 2020-01-11 DIAGNOSIS — H5702 Anisocoria: Secondary | ICD-10-CM | POA: Diagnosis not present

## 2020-01-11 DIAGNOSIS — H02035 Senile entropion of left lower eyelid: Secondary | ICD-10-CM | POA: Diagnosis not present

## 2020-01-11 DIAGNOSIS — H02012 Cicatricial entropion of right lower eyelid: Secondary | ICD-10-CM | POA: Diagnosis not present

## 2020-01-11 DIAGNOSIS — H02831 Dermatochalasis of right upper eyelid: Secondary | ICD-10-CM | POA: Diagnosis not present

## 2020-01-11 DIAGNOSIS — H02032 Senile entropion of right lower eyelid: Secondary | ICD-10-CM | POA: Diagnosis not present

## 2020-01-11 DIAGNOSIS — H02834 Dermatochalasis of left upper eyelid: Secondary | ICD-10-CM | POA: Diagnosis not present

## 2020-01-11 DIAGNOSIS — H10021 Other mucopurulent conjunctivitis, right eye: Secondary | ICD-10-CM | POA: Diagnosis not present

## 2020-02-05 DIAGNOSIS — H02831 Dermatochalasis of right upper eyelid: Secondary | ICD-10-CM | POA: Diagnosis not present

## 2020-02-05 DIAGNOSIS — H02834 Dermatochalasis of left upper eyelid: Secondary | ICD-10-CM | POA: Diagnosis not present

## 2020-02-05 DIAGNOSIS — H02032 Senile entropion of right lower eyelid: Secondary | ICD-10-CM | POA: Diagnosis not present

## 2020-02-05 DIAGNOSIS — H10021 Other mucopurulent conjunctivitis, right eye: Secondary | ICD-10-CM | POA: Diagnosis not present

## 2020-02-05 DIAGNOSIS — H02015 Cicatricial entropion of left lower eyelid: Secondary | ICD-10-CM | POA: Diagnosis not present

## 2020-02-05 DIAGNOSIS — H02532 Eyelid retraction right lower eyelid: Secondary | ICD-10-CM | POA: Diagnosis not present

## 2020-02-05 DIAGNOSIS — H02012 Cicatricial entropion of right lower eyelid: Secondary | ICD-10-CM | POA: Diagnosis not present

## 2020-02-05 DIAGNOSIS — H02035 Senile entropion of left lower eyelid: Secondary | ICD-10-CM | POA: Diagnosis not present

## 2020-02-05 DIAGNOSIS — H5702 Anisocoria: Secondary | ICD-10-CM | POA: Diagnosis not present

## 2020-02-08 DIAGNOSIS — R69 Illness, unspecified: Secondary | ICD-10-CM | POA: Diagnosis not present

## 2020-04-01 ENCOUNTER — Ambulatory Visit (INDEPENDENT_AMBULATORY_CARE_PROVIDER_SITE_OTHER): Payer: Medicare HMO | Admitting: Ophthalmology

## 2020-04-01 ENCOUNTER — Other Ambulatory Visit: Payer: Self-pay

## 2020-04-01 ENCOUNTER — Encounter (INDEPENDENT_AMBULATORY_CARE_PROVIDER_SITE_OTHER): Payer: Self-pay | Admitting: Ophthalmology

## 2020-04-01 DIAGNOSIS — H35371 Puckering of macula, right eye: Secondary | ICD-10-CM | POA: Diagnosis not present

## 2020-04-01 DIAGNOSIS — H35372 Puckering of macula, left eye: Secondary | ICD-10-CM

## 2020-04-01 DIAGNOSIS — H43821 Vitreomacular adhesion, right eye: Secondary | ICD-10-CM

## 2020-04-01 NOTE — Patient Instructions (Signed)
Patient instructed to contact the office promptly for new onset visual acuity declines or distortions 

## 2020-04-01 NOTE — Progress Notes (Signed)
04/01/2020     CHIEF COMPLAINT Patient presents for Retina Follow Up (9 MO F/U OU///Pt reports blurry vision OS, no F/F, no pain or pressure. )   HISTORY OF PRESENT ILLNESS: Harry Arellano is a 74 y.o. male who presents to the clinic today for:   HPI    Retina Follow Up    Patient presents with  Other.  In both eyes.  This started 9 months ago.  Severity is mild.  Duration of 9 months.  Since onset it is stable. Additional comments: 9 MO F/U OU   Pt reports blurry vision OS, no F/F, no pain or pressure.        Last edited by Nichola Sizer D on 04/01/2020  3:30 PM. (History)      Referring physician: No referring provider defined for this encounter.  HISTORICAL INFORMATION:   Selected notes from the MEDICAL RECORD NUMBER    Lab Results  Component Value Date   HGBA1C 5.5 02/11/2018     CURRENT MEDICATIONS: No current outpatient medications on file. (Ophthalmic Drugs)   No current facility-administered medications for this visit. (Ophthalmic Drugs)   Current Outpatient Medications (Other)  Medication Sig  . B Complex Vitamins (VITAMIN B COMPLEX) TABS Take 1 OTC tablet daily for 6 months and then return to clinic. Please limit alcohol to no more than 2 drinks per day.  Marland Kitchen omeprazole (PRILOSEC) 40 MG capsule Take 1 capsule (40 mg total) by mouth daily.   No current facility-administered medications for this visit. (Other)      REVIEW OF SYSTEMS:    ALLERGIES Allergies  Allergen Reactions  . Penicillins Other (See Comments)    Pass out     PAST MEDICAL HISTORY Past Medical History:  Diagnosis Date  . Smoker    History reviewed. No pertinent surgical history.  FAMILY HISTORY History reviewed. No pertinent family history.  SOCIAL HISTORY Social History   Tobacco Use  . Smoking status: Former Smoker    Packs/day: 0.50  . Smokeless tobacco: Never Used  Substance Use Topics  . Alcohol use: Yes    Alcohol/week: 3.0 standard drinks    Types: 3 Cans  of beer per week  . Drug use: No         OPHTHALMIC EXAM:  Base Eye Exam    Visual Acuity (ETDRS)      Right Left   Dist cc 20/25 -2 20/70   Dist ph cc  20/40 +2   Correction: Glasses       Tonometry (Tonopen, 3:38 PM)      Right Left   Pressure 19 16       Pupils      Dark Light Shape React APD   Right 3 2 Round Brisk None   Left 4 3 Irregular Brisk None       Visual Fields (Counting fingers)      Left Right    Full Full       Extraocular Movement      Right Left    Full Full       Neuro/Psych    Oriented x3: Yes       Dilation    Both eyes: 1.0% Mydriacyl, 2.5% Phenylephrine @ 3:39 PM        Slit Lamp and Fundus Exam    External Exam      Right Left   External Normal Normal       Slit Lamp Exam  Right Left   Lids/Lashes no entropion Normal   Conjunctiva/Sclera White and quiet White and quiet   Cornea Clear Clear   Anterior Chamber Deep and quiet Deep and quiet   Iris Round and reactive Round and reactive   Lens Centered posterior chamber intraocular lens Centered anterior chamber intraocular lens   Anterior Vitreous Normal Normal       Fundus Exam      Right Left   Posterior Vitreous Normal Clear vitrectomized   Disc Normal Normal   C/D Ratio 0.3 0.3   Macula Shiny clinical epiretinal membrane no thickening Shiny clinical epiretinal membrane no thickening   Vessels Normal Normal   Periphery Normal Normal          IMAGING AND PROCEDURES  Imaging and Procedures for 04/01/20  OCT, Retina - OU - Both Eyes       Right Eye Quality was good. Scan locations included subfoveal. Central Foveal Thickness: 278. Progression has been stable. Findings include vitreomacular adhesion .   Left Eye Quality was good. Scan locations included subfoveal. Central Foveal Thickness: 297. Progression has been stable.   Notes OD with vitreomacular adhesion and some thickening of the fovea but no significant topographic distortion from clinical  epiretinal membrane  OS no change in epiretinal membrane nasal to the fovea, observe                ASSESSMENT/PLAN:  No problem-specific Assessment & Plan notes found for this encounter.      ICD-10-CM   1. Left epiretinal membrane  H35.372 OCT, Retina - OU - Both Eyes  2. Right epiretinal membrane  H35.371 OCT, Retina - OU - Both Eyes  3. Vitreomacular adhesion of right eye  H43.821 OCT, Retina - OU - Both Eyes    1.  OU, stable with stable acuity will observe  2.  3.  Ophthalmic Meds Ordered this visit:  No orders of the defined types were placed in this encounter.      Return in about 1 year (around 04/01/2021) for DILATE OU, OCT.  Patient Instructions  Patient instructed to contact the office promptly for new onset visual acuity declines or distortions    Explained the diagnoses, plan, and follow up with the patient and they expressed understanding.  Patient expressed understanding of the importance of proper follow up care.   Clent Demark Marrell Dicaprio M.D. Diseases & Surgery of the Retina and Vitreous [facility] [place] 04/01/20     Abbreviations: M myopia (nearsighted); A astigmatism; H hyperopia (farsighted); P presbyopia; Mrx spectacle prescription;  CTL contact lenses; OD right eye; OS left eye; OU both eyes  XT exotropia; ET esotropia; PEK punctate epithelial keratitis; PEE punctate epithelial erosions; DES dry eye syndrome; MGD meibomian gland dysfunction; ATs artificial tears; PFAT's preservative free artificial tears; [place] nuclear sclerotic cataract; PSC posterior subcapsular cataract; ERM epi-retinal membrane; PVD posterior vitreous detachment; RD retinal detachment; DM diabetes mellitus; DR diabetic retinopathy; NPDR non-proliferative diabetic retinopathy; PDR proliferative diabetic retinopathy; CSME clinically significant macular edema; DME diabetic macular edema; dbh dot blot hemorrhages; CWS cotton wool spot; POAG primary open angle glaucoma; C/D  cup-to-disc ratio; HVF humphrey visual field; GVF goldmann visual field; OCT optical coherence tomography; IOP intraocular pressure; BRVO Branch retinal vein occlusion; CRVO central retinal vein occlusion; CRAO central retinal artery occlusion; BRAO branch retinal artery occlusion; RT retinal tear; SB scleral buckle; PPV pars plana vitrectomy; VH Vitreous hemorrhage; PRP panretinal laser photocoagulation; IVK intravitreal kenalog; VMT vitreomacular traction; MH Macular  hole;  NVD neovascularization of the disc; NVE neovascularization elsewhere; AREDS age related eye disease study; ARMD age related macular degeneration; POAG primary open angle glaucoma; EBMD epithelial/anterior basement membrane dystrophy; ACIOL anterior chamber intraocular lens; IOL intraocular lens; PCIOL posterior chamber intraocular lens; Phaco/IOL phacoemulsification with intraocular lens placement; [place] photorefractive keratectomy; LASIK laser assisted in situ keratomileusis; HTN hypertension; DM diabetes mellitus; COPD chronic obstructive pulmonary disease

## 2021-03-31 ENCOUNTER — Encounter (INDEPENDENT_AMBULATORY_CARE_PROVIDER_SITE_OTHER): Payer: Medicare HMO | Admitting: Ophthalmology

## 2021-04-18 DIAGNOSIS — Z23 Encounter for immunization: Secondary | ICD-10-CM | POA: Diagnosis not present

## 2021-04-18 DIAGNOSIS — Z125 Encounter for screening for malignant neoplasm of prostate: Secondary | ICD-10-CM | POA: Diagnosis not present

## 2021-04-18 DIAGNOSIS — Z13220 Encounter for screening for lipoid disorders: Secondary | ICD-10-CM | POA: Diagnosis not present

## 2021-04-18 DIAGNOSIS — R030 Elevated blood-pressure reading, without diagnosis of hypertension: Secondary | ICD-10-CM | POA: Diagnosis not present

## 2021-04-18 DIAGNOSIS — K219 Gastro-esophageal reflux disease without esophagitis: Secondary | ICD-10-CM | POA: Diagnosis not present

## 2021-04-18 DIAGNOSIS — Z79899 Other long term (current) drug therapy: Secondary | ICD-10-CM | POA: Diagnosis not present

## 2021-05-06 DIAGNOSIS — I10 Essential (primary) hypertension: Secondary | ICD-10-CM | POA: Diagnosis not present

## 2021-05-06 DIAGNOSIS — R7301 Impaired fasting glucose: Secondary | ICD-10-CM | POA: Diagnosis not present

## 2021-05-06 DIAGNOSIS — Z789 Other specified health status: Secondary | ICD-10-CM | POA: Diagnosis not present

## 2021-05-06 DIAGNOSIS — K219 Gastro-esophageal reflux disease without esophagitis: Secondary | ICD-10-CM | POA: Diagnosis not present

## 2021-07-04 DIAGNOSIS — Z8639 Personal history of other endocrine, nutritional and metabolic disease: Secondary | ICD-10-CM | POA: Diagnosis not present

## 2021-07-04 DIAGNOSIS — Z1211 Encounter for screening for malignant neoplasm of colon: Secondary | ICD-10-CM | POA: Diagnosis not present

## 2021-07-04 DIAGNOSIS — Z0000 Encounter for general adult medical examination without abnormal findings: Secondary | ICD-10-CM | POA: Diagnosis not present

## 2021-07-04 DIAGNOSIS — I10 Essential (primary) hypertension: Secondary | ICD-10-CM | POA: Diagnosis not present

## 2021-07-10 DIAGNOSIS — E870 Hyperosmolality and hypernatremia: Secondary | ICD-10-CM | POA: Diagnosis not present

## 2021-10-07 DIAGNOSIS — Z09 Encounter for follow-up examination after completed treatment for conditions other than malignant neoplasm: Secondary | ICD-10-CM | POA: Diagnosis not present

## 2021-10-07 DIAGNOSIS — D125 Benign neoplasm of sigmoid colon: Secondary | ICD-10-CM | POA: Diagnosis not present

## 2021-10-07 DIAGNOSIS — Z86010 Personal history of colonic polyps: Secondary | ICD-10-CM | POA: Diagnosis not present

## 2021-10-07 DIAGNOSIS — D124 Benign neoplasm of descending colon: Secondary | ICD-10-CM | POA: Diagnosis not present

## 2021-10-07 DIAGNOSIS — K5730 Diverticulosis of large intestine without perforation or abscess without bleeding: Secondary | ICD-10-CM | POA: Diagnosis not present

## 2021-10-07 DIAGNOSIS — K648 Other hemorrhoids: Secondary | ICD-10-CM | POA: Diagnosis not present

## 2021-10-09 DIAGNOSIS — D124 Benign neoplasm of descending colon: Secondary | ICD-10-CM | POA: Diagnosis not present

## 2021-10-09 DIAGNOSIS — D125 Benign neoplasm of sigmoid colon: Secondary | ICD-10-CM | POA: Diagnosis not present

## 2022-01-06 DIAGNOSIS — Z23 Encounter for immunization: Secondary | ICD-10-CM | POA: Diagnosis not present

## 2022-01-06 DIAGNOSIS — R7303 Prediabetes: Secondary | ICD-10-CM | POA: Diagnosis not present

## 2022-01-06 DIAGNOSIS — I10 Essential (primary) hypertension: Secondary | ICD-10-CM | POA: Diagnosis not present

## 2022-01-22 DIAGNOSIS — Z23 Encounter for immunization: Secondary | ICD-10-CM | POA: Diagnosis not present

## 2022-07-13 DIAGNOSIS — I10 Essential (primary) hypertension: Secondary | ICD-10-CM | POA: Diagnosis not present

## 2022-07-13 DIAGNOSIS — Z789 Other specified health status: Secondary | ICD-10-CM | POA: Diagnosis not present

## 2022-07-13 DIAGNOSIS — K219 Gastro-esophageal reflux disease without esophagitis: Secondary | ICD-10-CM | POA: Diagnosis not present

## 2022-07-13 DIAGNOSIS — Z8639 Personal history of other endocrine, nutritional and metabolic disease: Secondary | ICD-10-CM | POA: Diagnosis not present

## 2022-07-13 DIAGNOSIS — Z0000 Encounter for general adult medical examination without abnormal findings: Secondary | ICD-10-CM | POA: Diagnosis not present

## 2022-07-13 DIAGNOSIS — Z23 Encounter for immunization: Secondary | ICD-10-CM | POA: Diagnosis not present

## 2022-07-13 DIAGNOSIS — R7303 Prediabetes: Secondary | ICD-10-CM | POA: Diagnosis not present

## 2023-07-19 DIAGNOSIS — R7303 Prediabetes: Secondary | ICD-10-CM | POA: Diagnosis not present

## 2023-07-19 DIAGNOSIS — G8929 Other chronic pain: Secondary | ICD-10-CM | POA: Diagnosis not present

## 2023-07-19 DIAGNOSIS — K219 Gastro-esophageal reflux disease without esophagitis: Secondary | ICD-10-CM | POA: Diagnosis not present

## 2023-07-19 DIAGNOSIS — M25511 Pain in right shoulder: Secondary | ICD-10-CM | POA: Diagnosis not present

## 2023-07-19 DIAGNOSIS — M720 Palmar fascial fibromatosis [Dupuytren]: Secondary | ICD-10-CM | POA: Diagnosis not present

## 2023-07-19 DIAGNOSIS — Z8639 Personal history of other endocrine, nutritional and metabolic disease: Secondary | ICD-10-CM | POA: Diagnosis not present

## 2023-07-19 DIAGNOSIS — Z789 Other specified health status: Secondary | ICD-10-CM | POA: Diagnosis not present

## 2023-07-19 DIAGNOSIS — Z0000 Encounter for general adult medical examination without abnormal findings: Secondary | ICD-10-CM | POA: Diagnosis not present

## 2023-07-19 DIAGNOSIS — I10 Essential (primary) hypertension: Secondary | ICD-10-CM | POA: Diagnosis not present

## 2023-09-07 ENCOUNTER — Encounter: Payer: Self-pay | Admitting: Orthopaedic Surgery

## 2023-09-07 ENCOUNTER — Ambulatory Visit: Admitting: Orthopaedic Surgery

## 2023-09-07 ENCOUNTER — Other Ambulatory Visit (INDEPENDENT_AMBULATORY_CARE_PROVIDER_SITE_OTHER)

## 2023-09-07 DIAGNOSIS — M25511 Pain in right shoulder: Secondary | ICD-10-CM | POA: Diagnosis not present

## 2023-09-07 DIAGNOSIS — G8929 Other chronic pain: Secondary | ICD-10-CM | POA: Diagnosis not present

## 2023-09-07 NOTE — Progress Notes (Signed)
 Office Visit Note   Patient: Harry Arellano           Date of Birth: 1945-10-10           MRN: [id number] Visit Date: 09/07/2023              Requested by: Chyrel Craw, NP [phone] [address] [place],  [place] [address] PCP: Patient, No Pcp Per   Assessment & Plan: Visit Diagnoses:  1. Chronic right shoulder pain     Plan: History of Present Illness Cruze Glomski is a 78 year old male who presents with right shoulder pain and Dupuytren's contracture. He is accompanied by his daughter and language interpreter.  He experiences right shoulder pain that radiates from his finger to his shoulder, particularly affecting him at night and interfering with sleep. The pain originates in the finger and travels up to the posterior shoulder. There is no associated neck pain. He has weakness in the shoulder without pain during specific movements.  He has Dupuytren's contracture in his hand, preventing him from straightening his hand and causing it to shake when attempting to do so. This condition is of significant concern to him, and he wants to address it first.  Physical Exam MUSCULOSKELETAL: No pain on shoulder rotation. Pain on shoulder flexion. Shoulder flexion to 120 degrees, abduction to 90 degrees. Supraspinatus weak, no pain. Pain in rhomboid area on resistance and palpation.  Examination of the right hand is consistent with Dupuytren's contracture of the ring finger.  Results RADIOLOGY Shoulder X-ray: No acute abnormalities. No fractures or dislocations. Acromioclavicular joint arthritis. Glenohumeral joint arthritis. (09/07/2023)  Assessment and Plan Right shoulder pain due to trigger points Chronic right shoulder pain due to muscular trigger points, with radiating pain and tenderness in the rhomboid area. No acute abnormalities on x-ray. Pain exacerbated by shoulder flexion and abduction. Weakness noted without pain. Likely related to muscle tightness and imbalance. No surgical  intervention required. - Recommend physical therapy, acupuncture, or massage therapy for muscle relaxation and pain relief. - Advise use of a golf ball or tennis ball in a sock for self-massage against a wall.  Arthritis of shoulder Mild arthritis in the acromioclavicular and glenohumeral joints on x-ray. Not the primary source of current symptoms, which are more related to muscular issues.  Dupuytren's contracture Dupuytren's contracture causing difficulty in straightening the finger. Separate from shoulder pain. Options include surgical intervention or injection therapy. - Refer to hand surgeon for evaluation and discussion of treatment options, including surgery or injection therapy.  Follow-Up Instructions: No follow-ups on file.   Orders:  Orders Placed This Encounter  Procedures   XR Shoulder Right   No orders of the defined types were placed in this encounter.   Subjective: Chief Complaint  Patient presents with   Right Shoulder - Pain    HPI  Review of Systems  Constitutional: Negative.   HENT: Negative.    Eyes: Negative.   Respiratory: Negative.    Cardiovascular: Negative.   Gastrointestinal: Negative.   Endocrine: Negative.   Genitourinary: Negative.   Skin: Negative.   Allergic/Immunologic: Negative.   Neurological: Negative.   Hematological: Negative.   Psychiatric/Behavioral: Negative.    All other systems reviewed and are negative.    Objective: Vital Signs: There were no vitals taken for this visit.  Physical Exam Vitals and nursing note reviewed.  Constitutional:      Appearance: He is well-developed.  HENT:     Head: Normocephalic and atraumatic.  Eyes:     Pupils: Pupils are equal, round, and reactive to light.  Pulmonary:     Effort: Pulmonary effort is normal.  Abdominal:     Palpations: Abdomen is soft.  Musculoskeletal:        General: Normal range of motion.     Cervical back: Neck supple.  Skin:    General: Skin is warm.   Neurological:     Mental Status: He is alert and oriented to person, place, and time.  Psychiatric:        Behavior: Behavior normal.        Thought Content: Thought content normal.        Judgment: Judgment normal.    Imaging: XR Shoulder Right Result Date: 09/07/2023 X-rays of the right shoulder show arthritic changes of the [facility] joint with significant joint space narrowing and osteophytic spurring.  Mild glenohumeral osteoarthritis.  No acute abnormalities.    PMFS History: Patient Active Problem List   Diagnosis Date Noted   Substance dependence (HCC) 06/08/2013   Language barrier 09/15/2012   NSAID-associated gastropathy 01/25/2012   Nicotine addiction 01/25/2012   GERD (gastroesophageal reflux disease) 01/25/2012   Past Medical History:  Diagnosis Date   Smoker     No family history on file.  History reviewed. No pertinent surgical history. Social History   Occupational History   Not on file  Tobacco Use   Smoking status: Former    Current packs/day: 0.50    Types: Cigarettes   Smokeless tobacco: Never  Substance and Sexual Activity   Alcohol use: Yes    Alcohol/week: 3.0 standard drinks of alcohol    Types: 3 Cans of beer per week   Drug use: No   Sexual activity: Not on file

## 2023-09-09 DIAGNOSIS — E782 Mixed hyperlipidemia: Secondary | ICD-10-CM | POA: Diagnosis not present

## 2023-09-29 ENCOUNTER — Other Ambulatory Visit (HOSPITAL_COMMUNITY): Payer: Self-pay | Admitting: Family Medicine

## 2023-09-29 DIAGNOSIS — E782 Mixed hyperlipidemia: Secondary | ICD-10-CM

## 2023-09-29 DIAGNOSIS — I10 Essential (primary) hypertension: Secondary | ICD-10-CM

## 2023-09-29 DIAGNOSIS — R7303 Prediabetes: Secondary | ICD-10-CM

## 2023-10-15 ENCOUNTER — Telehealth: Payer: Self-pay

## 2023-10-15 NOTE — Telephone Encounter (Signed)
 Lvm with interpreter to cb to r/s appt

## 2023-10-18 ENCOUNTER — Encounter: Admitting: Orthopedic Surgery

## 2023-10-18 NOTE — Telephone Encounter (Signed)
 Patient was r/s.

## 2023-10-27 ENCOUNTER — Ambulatory Visit: Admitting: Orthopedic Surgery

## 2023-10-27 ENCOUNTER — Telehealth: Payer: Self-pay

## 2023-10-27 ENCOUNTER — Ambulatory Visit (INDEPENDENT_AMBULATORY_CARE_PROVIDER_SITE_OTHER): Payer: Self-pay

## 2023-10-27 ENCOUNTER — Ambulatory Visit (HOSPITAL_COMMUNITY)
Admission: RE | Admit: 2023-10-27 | Discharge: 2023-10-27 | Disposition: A | Discharge status: Home / Self Care | Payer: Self-pay | Source: Ambulatory Visit | Attending: Family Medicine | Admitting: Family Medicine

## 2023-10-27 DIAGNOSIS — M79641 Pain in right hand: Secondary | ICD-10-CM | POA: Diagnosis not present

## 2023-10-27 DIAGNOSIS — E782 Mixed hyperlipidemia: Secondary | ICD-10-CM | POA: Insufficient documentation

## 2023-10-27 DIAGNOSIS — M720 Palmar fascial fibromatosis [Dupuytren]: Secondary | ICD-10-CM

## 2023-10-27 DIAGNOSIS — R7303 Prediabetes: Secondary | ICD-10-CM | POA: Insufficient documentation

## 2023-10-27 DIAGNOSIS — I10 Essential (primary) hypertension: Secondary | ICD-10-CM | POA: Insufficient documentation

## 2023-10-27 NOTE — Telephone Encounter (Signed)
 Faxed completed Xiaflex form to [employer] at [phone] for right hand

## 2023-10-27 NOTE — Telephone Encounter (Signed)
 Please get auth for xiaflex inj for right hand

## 2023-10-27 NOTE — Progress Notes (Signed)
 Harry Arellano - 78 y.o. male MRN [id number]  Date of birth: 04/18/1946  Office Visit Note: Visit Date: 10/27/2023 PCP: Patient, No Pcp Per Referred by: No ref. provider found  Subjective: No chief complaint on file.  HPI: Harry Arellano is a pleasant 78 y.o. male who presents today for evaluation of right hand ring finger Dupuytren contracture has been present for multiple years.  He presents as a referral to me from one of my partners for specific hand surgical evaluation.  Has not undergone any prior treatments for the contracture, has noted progression over the past year.  Unsure of any specific family history.  He is being seen today with the aid of an interpreter as well as his daughter.  Pertinent ROS were reviewed with the patient and found to be negative unless otherwise specified above in HPI.   Visit Reason:Right hand ring finger dupuytren contracture Duration of symptoms: two years Hand dominance: right Occupation: Retired Diabetic: No Smoking: No Heart/Lung History:none Blood Thinners: none  Prior Testing/EMG:none Injections (Date):none Treatments:none Prior Surgery:none  Assessment & Plan: Visit Diagnoses:  1. Dupuytren's contracture of right hand   2. Pain in right hand     Plan: Extensive discussion was had with the patient today with the use of an interpreter regarding his right hand Dupuytren's contracture.  There is a palpable cord in line with the right ring finger with associated contracture at the MP joint approximately 45 degrees, associated positive tabletop test.  We talked about the etiology pathophysiology of this condition as well as its persistence despite treatment.  From a treatment standpoint, we discussed stretching and splinting, activity modification, acceptance, Xiaflex injection versus possible surgical intervention.  We discussed risks and benefits of the above-mentioned treatment procedures.  Understanding his options, patient would like to proceed  with Xiaflex injection for the right ring finger MP contracture.  We discussed the treatment process as well as the postmanipulation process with therapy and splinting.  Risks and benefits of the injection were discussed in detail, risk including but not limited to skin tearing, nerve injury, vascular, tendon injury, risk of recurrence and need for subsequent procedure.  Patient and family member expressed understanding.  We will contact the patient when the Xiaflex injection has been approved and ready.  I spent 45 minutes in the care of this patient today including review of previous documentation, imaging obtained, face-to-face time discussing all options regarding treatment and documenting the encounter.   Follow-up: No follow-ups on file.   Meds & Orders: No orders of the defined types were placed in this encounter.   Orders Placed This Encounter  Procedures   XR Hand Complete Right     Procedures: No procedures performed      Clinical History: No specialty comments available.  He reports that he has quit smoking. He has never used smokeless tobacco. No results for input(s): HGBA1C, LABURIC in the last 8760 hours.  Objective:   Vital Signs: There were no vitals taken for this visit.  Physical Exam  Gen: Well-appearing, in no acute distress; non-toxic CV: Regular Rate. Well-perfused. Warm.  Resp: Breathing unlabored on room air; no wheezing. Psych: Fluid speech in conversation; appropriate affect; normal thought process  Ortho Exam Right hand ring finger: - Palpable cord in line with the ring finger palmar aspect, MP contracture approximately 45 degrees with positive tabletop test, PIP remains neutral  Imaging: XR Hand Complete Right Result Date: 10/27/2023 Diffuse bone demineralization is seen with associated ulnar positivity and  concern for impaction.  Notable degenerative changes seen with the small joints diffusely throughout the DIP regions as well as thumb.   Past  Medical/Family/Surgical/Social History: Medications & Allergies reviewed per EMR, new medications updated. Patient Active Problem List   Diagnosis Date Noted   Substance dependence (HCC) 06/08/2013   Language barrier 09/15/2012   NSAID-associated gastropathy 01/25/2012   Nicotine addiction 01/25/2012   GERD (gastroesophageal reflux disease) 01/25/2012   Past Medical History:  Diagnosis Date   Smoker    No family history on file. No past surgical history on file. Social History   Occupational History   Not on file  Tobacco Use   Smoking status: Former    Current packs/day: 0.50    Types: Cigarettes   Smokeless tobacco: Never  Substance and Sexual Activity   Alcohol use: Yes    Alcohol/week: 3.0 standard drinks of alcohol    Types: 3 Cans of beer per week   Drug use: No   Sexual activity: Not on file    Tangela Dolliver Estela) Ar[place], M.D. [facility], Hand Surgery

## 2023-12-21 ENCOUNTER — Telehealth: Payer: Self-pay

## 2023-12-21 NOTE — Telephone Encounter (Signed)
 Can you please order Xiaflex, for right hand injection.  Thank you.

## 2023-12-24 ENCOUNTER — Other Ambulatory Visit: Payer: Self-pay

## 2023-12-24 DIAGNOSIS — M720 Palmar fascial fibromatosis [Dupuytren]: Secondary | ICD-10-CM

## 2023-12-24 NOTE — Telephone Encounter (Signed)
 Harry Arellano I will help you schedule this on Tuesday:)

## 2023-12-24 NOTE — Telephone Encounter (Signed)
 Please schedule patient for Xiaflex injection.  Thank you

## 2024-01-06 NOTE — Telephone Encounter (Signed)
 Tried calling with the help of an interpreter through [employer]. LMOM to cb to schedule Xiaflex injections.
